# Patient Record
Sex: Male | Born: 1995 | Race: White | Hispanic: No | Marital: Single | Smoking: Current every day smoker
Health system: Southern US, Community
[De-identification: ages and names within clinical notes are randomized; demographics above are authoritative.]

---

## 2019-03-05 ENCOUNTER — Encounter (HOSPITAL_COMMUNITY): Payer: Self-pay | Admitting: Emergency Medicine

## 2019-03-05 ENCOUNTER — Other Ambulatory Visit: Payer: Self-pay

## 2019-03-05 ENCOUNTER — Emergency Department (HOSPITAL_COMMUNITY)
Admission: EM | Admit: 2019-03-05 | Discharge: 2019-03-05 | Disposition: A | Discharge status: Home / Self Care | Payer: Self-pay | Attending: Emergency Medicine | Admitting: Emergency Medicine

## 2019-03-05 DIAGNOSIS — F1910 Other psychoactive substance abuse, uncomplicated: Secondary | ICD-10-CM | POA: Insufficient documentation

## 2019-03-05 DIAGNOSIS — F17210 Nicotine dependence, cigarettes, uncomplicated: Secondary | ICD-10-CM | POA: Insufficient documentation

## 2019-03-05 LAB — RAPID URINE DRUG SCREEN, HOSP PERFORMED
Amphetamines: POSITIVE — AB
Barbiturates: NOT DETECTED
Benzodiazepines: NOT DETECTED
Cocaine: NOT DETECTED
Opiates: NOT DETECTED
Tetrahydrocannabinol: POSITIVE — AB

## 2019-03-05 NOTE — ED Triage Notes (Signed)
Pt states he is trying to get into [facility] and he needs ETOH detox or a letter stating that he doesn't need detox.  Pt denies ETOH problem.  States he goes 2-3 weeks at a time without drinking and may sometimes drink 2-3 days in a row.  PT denies having withdrawal symptoms when not drinking.

## 2019-03-05 NOTE — Discharge Instructions (Addendum)
Mr. Sevey was seen in the ER for medical clearance. Although Mr. Pieper admits to substance abuse earlier this week he is not in need for medical detox at this time. We have advised him to refrain from substance abuse and given him resources on rehab facilities.   Substance Abuse Treatment Programs  Intensive Outpatient Programs [facility]     [address]      [place], [place]                   [phone]       [facility] [address] [place] [address] [place], [place] [phone]  [facility]     (Inpatient and outpatient)     [address]           [phone]    [facility] [phone] (Suboxone and Methadone)  [address]      [place], [place] [address]      [phone]       [address] [place], [place] [phone]  [facility] (Outpatient/Inpatient, [facility])    (insurance only) [phone]             [facility] (Groups & Residential) [place], [place] [phone]     [facility]     [address]     [place], [place]      [phone]       [facility] (for caregivers and family) [phone] [address] Laurell [address] [place], [place] [phone]      Residential Treatment Programs [facility]      [address] [place], [place] [address]  [phone]       [facility] [address], [place], [place] [address] [phone]  [facility] [place]        [phone]       [facility] [phone]  [facility] ([facility]             [address]                                         [place], [place]                                                [phone] or [phone]                               [facility] [place] [address] [place], [address] [phone]  [facility]    [address]      [place], [place]     [phone]       [facility] [address] [place], [place] [phone]  [facility]   [address] [place], [place] [address]     [phone]      Admissions: 8am-3pm M-F  [facility] ([facility]) [address] [place], [place] [phone]  [facility]: Residential Program [phone] Days)   [place], [place]      [phone] or [phone]     [facility]: [facility] [place], [place] (Walk in Hours over the  weekend or by referral)  [facility] [place], [place], [place] [address] [phone]  Crisis Mobile: [facility]:  [phone] (for crisis response 24 hours a day) [facility] Hotline:      [phone] Outpatient Psychiatry and Counseling  [facility]: Mobile Crisis Management 24 hours:  [phone]  [facility] of the [employer] sliding scale fee and walk in schedule: M-F 8am-12pm/1pm-3pm [place], [place] [address] [address] [place], [place] [address] [phone]  [facility] ([facility])- new patient walk-in appointments available Monday - Friday 8am -3pm.          [address] [place], [place] [address] [phone] or crisis line- [facility]/ Intensive Outpatient Therapy Program [place], [place] [address] [place]      [phone] [address] [place], [place] [address]                 [place]   [facility] [phone]. [place], [place] [address]   [facility]          [address] Johnette Abraham  [place], [place] [address]       [phone]  [facility] [address], [place], [place]  [address] [phone]  [facility]    [address] [place], [place] [address]     [place], [place] Joycelyn Man     [place] [address]     [phone]       [facility] [place] [address]  [facility]     [address], [place], [place], [place] [address] [place]     [address]     [place], [place] [address]     [phone]       [facility] [address] [place], [place] [address] [phone] Resources for Temporary Residential Assistance/Crisis [facility]) M-F 8am-3pm   [address], [place] [address]   [phone] Services include: laundry, barbering, support groups, case management, phone  & computer access, showers, AA/NA mtgs, mental health/substance abuse nurse, job skills class, disability information, VA assistance, spiritual classes, etc.   [facility]   [address], [place]     [place]              [employer] (women and children)       [place]. [place], [place] [address] [phone] [email or web address] for application and process Application Required  [facility]   [address]    [place]  [address]     [phone]  [place], [place] [address] [phone](DUKGURKY application appt.) Application Required  [facility] (women only)    [address]     [place], [place] [address]     [phone]      Intake starts 6pm daily Need valid ID, SSC, & Police report [employer] [address] [place], [place] [phone] Application Required  [employer] (men only)     [place].      [place], [place]       [place] (Pregnant women only) [address] [place], [place]  [facility]      [address]      [place], [place] [address]     [phone]             [facility] [address] [place], [place] 90 day commitment/SA/Application process  [facility](men only)     [address]     [place], [place]       Check-in at [facility] [address] [place], [place] [address] [phone] Men/Women/Women and Children must be there by 7 pm  [place], [place]

## 2019-03-05 NOTE — ED Provider Notes (Signed)
[facility] EMERGENCY DEPARTMENT Provider Note   CSN: [id number] Arrival date & time: 03/05/19  0254     History   Chief Complaint Chief Complaint  Patient presents with  . Drug / Alcohol Assessment    HPI Joel Shaw is a 23 y.o. male.     HPI  23 year old comes in a chief complaint of detox. Patient admits to polysubstance abuse.  Over the last week he has used cocaine, meth, heroin.  He will drink alcohol heavily on occasions.  He enrolled in a work training program and was sent to the ER for detox.  He denies any chest pain, palpitations, abdominal pain, headaches, vomiting.   History reviewed. No pertinent past medical history.  There are no active problems to display for this patient.   History reviewed. No pertinent surgical history.      Home Medications    Prior to Admission medications   Not on File    Family History No family history on file.  Social History Social History   Tobacco Use  . Smoking status: Current Every Day Smoker  . Smokeless tobacco: Never Used  Substance Use Topics  . Alcohol use: Yes  . Drug use: Not Currently    Types: Methamphetamines, Cocaine    Comment: Meth, Heroin, crack- denies current use 03/05/19     Allergies   Patient has no allergy information on record.   Review of Systems Review of Systems  Constitutional: Negative for activity change.  Respiratory: Negative for shortness of breath.   Cardiovascular: Negative for chest pain.  Gastrointestinal: Negative for nausea and vomiting.  Neurological: Negative for headaches.     Physical Exam Updated Vital Signs BP 124/77 (BP Location: Right Arm)   Pulse 72   Temp 98.5 F (36.9 C) (Oral)   Resp 18   SpO2 100%   Physical Exam Vitals signs and nursing note reviewed.  Constitutional:      Appearance: He is well-developed.  HENT:     Head: Atraumatic.  Neck:     Musculoskeletal: Neck supple.  Cardiovascular:     Rate and  Rhythm: Normal rate.  Pulmonary:     Effort: Pulmonary effort is normal.  Skin:    General: Skin is warm.     Findings: Bruising, lesion and rash present.  Neurological:     Mental Status: He is alert and oriented to person, place, and time.      ED Treatments / Results  Labs (all labs ordered are listed, but only abnormal results are displayed) Labs Reviewed  RAPID URINE DRUG SCREEN, HOSP PERFORMED - Abnormal; Notable for the following components:      Result Value   Amphetamines POSITIVE (*)    Tetrahydrocannabinol POSITIVE (*)    All other components within normal limits    EKG None  Radiology No results found.  Procedures Procedures (including critical care time)  Medications Ordered in ED Medications - No data to display   Initial Impression / Assessment and Plan / ED Course  I have reviewed the triage vital signs and the nursing notes.  Pertinent labs & imaging results that were available during my care of the patient were reviewed by me and considered in my medical decision making (see chart for details).        Patient comes in a chief complaint of detox. Essentially he has enrolled in a work education program and given his polysubstance abuse they are requesting medical detox.  Patient admits to using  multiple drugs.  His last use was 2 days ago and it was meth.  Currently he is not having any palpitations, tremors, cardiac, GI, neuro symptoms.  He does not need any medical detox at this time.  We have provided him with peer support consult, outpatient rehab facility options.   Final Clinical Impressions(s) / ED Diagnoses   Final diagnoses:  Polysubstance abuse [facility])    ED Discharge Orders    None       Derwood Kaplan, MD 03/05/19 1117

## 2019-03-17 ENCOUNTER — Other Ambulatory Visit: Payer: Self-pay

## 2019-03-17 DIAGNOSIS — Z20822 Contact with and (suspected) exposure to covid-19: Secondary | ICD-10-CM

## 2019-03-18 LAB — NOVEL CORONAVIRUS, NAA: SARS-CoV-2, NAA: NOT DETECTED

## 2019-03-29 ENCOUNTER — Emergency Department (HOSPITAL_COMMUNITY)
Admission: EM | Admit: 2019-03-29 | Discharge: 2019-03-30 | Disposition: A | Discharge status: Home / Self Care | Payer: Self-pay | Attending: Emergency Medicine | Admitting: Emergency Medicine

## 2019-03-29 ENCOUNTER — Other Ambulatory Visit: Payer: Self-pay

## 2019-03-29 ENCOUNTER — Emergency Department (HOSPITAL_COMMUNITY): Payer: Self-pay

## 2019-03-29 ENCOUNTER — Encounter (HOSPITAL_COMMUNITY): Payer: Self-pay | Admitting: Emergency Medicine

## 2019-03-29 DIAGNOSIS — F1410 Cocaine abuse, uncomplicated: Secondary | ICD-10-CM | POA: Insufficient documentation

## 2019-03-29 DIAGNOSIS — Z5900 Homelessness unspecified: Secondary | ICD-10-CM

## 2019-03-29 DIAGNOSIS — F17210 Nicotine dependence, cigarettes, uncomplicated: Secondary | ICD-10-CM | POA: Insufficient documentation

## 2019-03-29 DIAGNOSIS — T401X1A Poisoning by heroin, accidental (unintentional), initial encounter: Secondary | ICD-10-CM | POA: Insufficient documentation

## 2019-03-29 DIAGNOSIS — F1910 Other psychoactive substance abuse, uncomplicated: Secondary | ICD-10-CM | POA: Insufficient documentation

## 2019-03-29 NOTE — Discharge Instructions (Addendum)
In addition to these prescriptions he may take naproxen, 500 mg 2 times a day as needed for aching, pain, or discomfort.   You may also take Imodium, 2 to 4 mg as needed for diarrhea or loose stools. Some of the prescribed medications such as the methocarbromal and hydroxyzine can make you drowsy and sleepy.

## 2019-03-29 NOTE — ED Notes (Signed)
X-ray at bedside

## 2019-03-29 NOTE — ED Provider Notes (Signed)
[facility] DEPT Provider Note   CSN: [id number] Arrival date & time: 03/29/19  2114     History Chief Complaint  Patient presents with  . Drug Overdose    Joel Shaw is a 24 y.o. male with a past medical history of polysubstance abuse including cocaine, meth, and heroin who presents today for evaluation after he required Narcan. [place]  He states that he snorts heroin and does not inject it.  Tonight he used a speed ball which he clarifies as cocaine mixed with heroin.  He states that this was a new batch.  He remembers going into the bathroom to do a second dose and then does not remember anything after.  According to triage notes he required Narcan.    He denies any pains at this time.  He reports that he has not used over the past 3 days as he is trying to get clean however based on his withdrawal related symptoms he used today to stop the symptoms.  He states that he has a job that he is trying to get and needs to be clean.   He states that he has never required Narcan before.  He denies any SI, HI, or AVH.  He states this was not an attempt to harm himself or end his life.  HPI     History reviewed. No pertinent past medical history.  There are no problems to display for this patient.   History reviewed. No pertinent surgical history.     History reviewed. No pertinent family history.  Social History   Tobacco Use  . Smoking status: Current Every Day Smoker  . Smokeless tobacco: Never Used  Substance Use Topics  . Alcohol use: Yes  . Drug use: Not Currently    Types: Methamphetamines, Cocaine    Comment: Meth, Heroin, crack- denies current use 03/05/19    Home Medications Prior to Admission medications   Medication Sig Start Date End Date Taking? Authorizing Provider  dicyclomine (BENTYL) 20 MG tablet Take 1 tablet (20 mg total) by mouth every 6 (six) hours as needed for spasms (abdominal pain).  03/30/19   Lorin Glass, PA-C  hydrOXYzine (ATARAX/VISTARIL) 25 MG tablet Take 1 tablet (25 mg total) by mouth every 6 (six) hours as needed for anxiety. 03/30/19   Lorin Glass, PA-C  methocarbamol (ROBAXIN) 500 MG tablet Take 1 tablet (500 mg total) by mouth every 8 (eight) hours as needed for muscle spasms. 03/30/19   Lorin Glass, PA-C  ondansetron (ZOFRAN ODT) 4 MG disintegrating tablet Take 1 tablet (4 mg total) by mouth every 6 (six) hours as needed for nausea or vomiting. 03/30/19   Lorin Glass, PA-C    Allergies    Patient has no known allergies.  Review of Systems   Review of Systems  Constitutional: Negative for chills and fever.  HENT: Negative for congestion.   Eyes: Negative for visual disturbance.  Respiratory: Negative for cough and chest tightness.   Cardiovascular: Negative for chest pain.  Gastrointestinal: Negative for abdominal pain, diarrhea, nausea and vomiting.  Genitourinary: Negative for dysuria.  Musculoskeletal: Negative for back pain and neck pain.  Neurological: Negative for weakness and headaches.  Psychiatric/Behavioral: Negative for confusion and self-injury.  All other systems reviewed and are negative.   Physical Exam Updated Vital Signs BP 116/65 (BP Location: Right Arm)   Pulse 76   Temp 98.7 F (37.1 C) (Oral)   Resp 18  Ht 5' 9.5" (1.765 m)   Wt 81.6 kg   SpO2 98%   BMI 26.20 kg/m   Physical Exam Vitals and nursing note reviewed.  Constitutional:      General: He is not in acute distress.    Appearance: He is well-developed. He is not diaphoretic.  HENT:     Head: Normocephalic and atraumatic.  Eyes:     General: No scleral icterus.       Right eye: No discharge.        Left eye: No discharge.     Conjunctiva/sclera: Conjunctivae normal.  Cardiovascular:     Rate and Rhythm: Normal rate and regular rhythm.  Pulmonary:     Effort: Pulmonary effort is normal. No respiratory distress.     Breath sounds:  No stridor.  Abdominal:     General: There is no distension.  Musculoskeletal:        General: No deformity.     Cervical back: Normal range of motion.  Skin:    General: Skin is warm and dry.  Neurological:     Mental Status: He is alert.     Motor: No abnormal muscle tone.     Comments: Patient awakens to name without difficulty.  He is alert and oriented x3.  His speech is not slurred.  Facial movements are symmetrical.  He moves all extremities spontaneously.    Psychiatric:        Mood and Affect: Mood normal.        Behavior: Behavior normal.     ED Results / Procedures / Treatments   Labs (all labs ordered are listed, but only abnormal results are displayed) Labs Reviewed  BASIC METABOLIC PANEL - Abnormal; Notable for the following components:      Result Value   Glucose, Bld 108 (*)    Calcium 8.7 (*)    All other components within normal limits  ETHANOL - Abnormal; Notable for the following components:   Alcohol, Ethyl (B) 73 (*)    All other components within normal limits  CBC WITH DIFFERENTIAL/PLATELET - Abnormal; Notable for the following components:   WBC 11.9 (*)    Platelets 124 (*)    Neutro Abs 8.3 (*)    All other components within normal limits    EKG None  Radiology DG Chest Port 1 View  Result Date: 03/29/2019 CLINICAL DATA:  Drug overdose. EXAM: PORTABLE CHEST 1 VIEW COMPARISON:  None. FINDINGS: Right costophrenic angle not included in the field of viewthe cardiomediastinal contours are normal. The lungs are clear. Pulmonary vasculature is normal. No consolidation, pleural effusion, or pneumothorax. No acute osseous abnormalities are seen. IMPRESSION: Negative portable AP view of the chest. Electronically Signed   By: Keith Rake M.D.   On: 03/29/2019 23:12    Procedures Procedures (including critical care time)  Medications Ordered in ED Medications  naloxone (NARCAN) nasal spray 4 mg/0.1 mL (1 spray Nasal Provided for home use 03/30/19  0129)    ED Course  I have reviewed the triage vital signs and the nursing notes.  Pertinent labs & imaging results that were available during my care of the patient were reviewed by me and considered in my medical decision making (see chart for details).    MDM Rules/Calculators/A&P                     Patient presents today for evaluation after a overdose.  He states that he used heroin and cocaine.  According to triage notes he required Narcan in the field. On my evaluation he awakens easily, is conversant.  He denies this being an intentional overdose, stating that he was going through withdrawals as he was trying to quit for a job he has which caused him to use.  I evaluated him multiple times while in the emergency room and he did not require additional Narcan.  He was observed for over 4-1/2 hours in the ER without requiring additional Narcan.  Narcan kit was ordered and he was provided instructions on how to use it.  He is given prescriptions to help with symptomatic treatment of opioid withdrawal.  He is also provided with resources for outpatient substance abuse and counseling, along with shelters as he is homeless.  He was able to p.o. challenge without difficulty.  Recommended risk reduction if he does continue to use and I spoke about some of the local organizations.  Labs are obtained he does have a slight leukocytosis at 11.9 however I suspect this is reactive.  BMP without significant abnormalities.  Ethanol is minimally elevated at 73 however he remained clinically sober while in my care.  Given his overdose chest x-ray was obtained without evidence of aspiration.  Return precautions were discussed with patient who states their understanding.  At the time of discharge patient denied any unaddressed complaints or concerns.  Patient is agreeable for discharge home.  Note: Portions of this report may have been transcribed using voice recognition software. Every effort was made  to ensure accuracy; however, inadvertent computerized transcription errors may be present   Final Clinical Impression(s) / ED Diagnoses Final diagnoses:  Accidental overdose of heroin, initial encounter ([place])  Polysubstance abuse [facility])  Homeless    Rx / DC Orders ED Discharge Orders         Ordered    ondansetron (ZOFRAN ODT) 4 MG disintegrating tablet  Every 6 hours PRN     03/30/19 0109    methocarbamol (ROBAXIN) 500 MG tablet  Every 8 hours PRN     03/30/19 0109    dicyclomine (BENTYL) 20 MG tablet  Every 6 hours PRN     03/30/19 0109    hydrOXYzine (ATARAX/VISTARIL) 25 MG tablet  Every 6 hours PRN     03/30/19 0109           Lorin Glass, PA-C 03/30/19 0144    Quintella Reichert, MD 03/30/19 1454

## 2019-03-29 NOTE — ED Notes (Signed)
Patient made aware that urine sample is needed. Urinal at bedside.  

## 2019-03-29 NOTE — ED Triage Notes (Signed)
Pt found down nonresponsive at [facility] station  by fire dept  who gave pt 2mg  narcan intranasal. Pt was more alert with EMS unknown down time

## 2019-03-30 LAB — CBC WITH DIFFERENTIAL/PLATELET
Abs Immature Granulocytes: 0.04 10*3/uL (ref 0.00–0.07)
Basophils Absolute: 0.1 10*3/uL (ref 0.0–0.1)
Basophils Relative: 0 %
Eosinophils Absolute: 0.1 10*3/uL (ref 0.0–0.5)
Eosinophils Relative: 1 %
HCT: 43.1 % (ref 39.0–52.0)
Hemoglobin: 14.3 g/dL (ref 13.0–17.0)
Immature Granulocytes: 0 %
Lymphocytes Relative: 24 %
Lymphs Abs: 2.8 10*3/uL (ref 0.7–4.0)
MCH: 30.9 pg (ref 26.0–34.0)
MCHC: 33.2 g/dL (ref 30.0–36.0)
MCV: 93.1 fL (ref 80.0–100.0)
Monocytes Absolute: 0.6 10*3/uL (ref 0.1–1.0)
Monocytes Relative: 5 %
Neutro Abs: 8.3 10*3/uL — ABNORMAL HIGH (ref 1.7–7.7)
Neutrophils Relative %: 70 %
Platelets: 124 10*3/uL — ABNORMAL LOW (ref 150–400)
RBC: 4.63 MIL/uL (ref 4.22–5.81)
RDW: 12.3 % (ref 11.5–15.5)
WBC: 11.9 10*3/uL — ABNORMAL HIGH (ref 4.0–10.5)
nRBC: 0 % (ref 0.0–0.2)

## 2019-03-30 LAB — BASIC METABOLIC PANEL
Anion gap: 10 (ref 5–15)
BUN: 11 mg/dL (ref 6–20)
CO2: 25 mmol/L (ref 22–32)
Calcium: 8.7 mg/dL — ABNORMAL LOW (ref 8.9–10.3)
Chloride: 104 mmol/L (ref 98–111)
Creatinine, Ser: 0.76 mg/dL (ref 0.61–1.24)
GFR calc Af Amer: >60 mL/min (ref >60)
GFR calc non Af Amer: >60 mL/min (ref >60)
Glucose, Bld: 108 mg/dL — ABNORMAL HIGH (ref 70–99)
Potassium: 4.1 mmol/L (ref 3.5–5.1)
Sodium: 139 mmol/L (ref 135–145)

## 2019-03-30 LAB — ETHANOL: Alcohol, Ethyl (B): 73 mg/dL — ABNORMAL HIGH (ref <10)

## 2019-03-30 MED ORDER — HYDROXYZINE HCL 25 MG PO TABS: 25.0000 mg | ORAL_TABLET | Freq: Four times a day (QID) | ORAL | 0 refills | Status: DC | PRN

## 2019-03-30 MED ORDER — DICYCLOMINE HCL 20 MG PO TABS: 20.0000 mg | ORAL_TABLET | Freq: Four times a day (QID) | ORAL | 0 refills | Status: DC | PRN

## 2019-03-30 MED ORDER — ONDANSETRON 4 MG PO TBDP: 4.0000 mg | ORAL_TABLET | Freq: Four times a day (QID) | ORAL | 0 refills | Status: DC | PRN

## 2019-03-30 MED ORDER — NALOXONE HCL 4 MG/0.1ML NA LIQD
1.0000 | Freq: Once | NASAL | Status: AC
  Administered 2019-03-30: 1 via NASAL
  Filled 2019-03-30: qty 4

## 2019-03-30 MED ORDER — METHOCARBAMOL 500 MG PO TABS: 500.0000 mg | ORAL_TABLET | Freq: Three times a day (TID) | ORAL | 0 refills | Status: DC | PRN

## 2020-07-16 ENCOUNTER — Emergency Department (HOSPITAL_COMMUNITY)
Admission: EM | Admit: 2020-07-16 | Discharge: 2020-07-16 | Disposition: A | Discharge status: Home / Self Care | Payer: Medicaid Other | Attending: Emergency Medicine | Admitting: Emergency Medicine

## 2020-07-16 ENCOUNTER — Encounter (HOSPITAL_COMMUNITY): Payer: Self-pay

## 2020-07-16 DIAGNOSIS — F17200 Nicotine dependence, unspecified, uncomplicated: Secondary | ICD-10-CM | POA: Insufficient documentation

## 2020-07-16 DIAGNOSIS — F1110 Opioid abuse, uncomplicated: Secondary | ICD-10-CM | POA: Insufficient documentation

## 2020-07-16 DIAGNOSIS — F1910 Other psychoactive substance abuse, uncomplicated: Secondary | ICD-10-CM

## 2020-07-16 MED ORDER — NALOXONE HCL 4 MG/0.1ML NA LIQD: NASAL | 0 refills | Status: AC

## 2020-07-16 NOTE — Discharge Instructions (Signed)
Return for any problem.  ?

## 2020-07-16 NOTE — ED Provider Notes (Signed)
[facility]-EMERGENCY DEPT Provider Note   CSN: [id number] Arrival date & time: 07/16/20  1931     History Chief Complaint  Patient presents with  . Drug Overdose    Joel Shaw is a 25 y.o. male.  25 year old male with prior medical history as detailed below presents for evaluation.  Patient reports that he was drinking a 40oz beer and then decided to snort some heroin.  Shortly after snorting the heroin he became unresponsive.  EMS was called to the scene.  Patient was given a total of 2 mg of Narcan intranasally with minimal effect.  He was then given 0.5 mg of Narcan through the IV with significant improvement in his mental status and respiratory drive.  Upon arrival to the ED the patient is alert and breathing comfortably.  He answers all questions.  He has no specific complaint.  He reports that he had 1 40 ounce drink prior to using what he thought was heroin.  The history is provided by the patient and medical records.  Drug Overdose This is a recurrent problem. The current episode started less than 1 hour ago. The problem occurs rarely. The problem has not changed since onset.Pertinent negatives include no chest pain and no abdominal pain. Nothing aggravates the symptoms. Nothing relieves the symptoms.       History reviewed. No pertinent past medical history.  There are no problems to display for this patient.   History reviewed. No pertinent surgical history.     History reviewed. No pertinent family history.  Social History   Tobacco Use  . Smoking status: Current Every Day Smoker  . Smokeless tobacco: Never Used  Substance Use Topics  . Alcohol use: Yes  . Drug use: Not Currently    Types: Methamphetamines, Cocaine    Comment: Meth, Heroin, crack- denies current use 03/05/19    Home Medications Prior to Admission medications   Medication Sig Start Date End Date Taking? Authorizing Provider  dicyclomine (BENTYL) 20 MG tablet Take  1 tablet (20 mg total) by mouth every 6 (six) hours as needed for spasms (abdominal pain). 03/30/19   Cristina Gong, PA-C  hydrOXYzine (ATARAX/VISTARIL) 25 MG tablet Take 1 tablet (25 mg total) by mouth every 6 (six) hours as needed for anxiety. 03/30/19   Cristina Gong, PA-C  methocarbamol (ROBAXIN) 500 MG tablet Take 1 tablet (500 mg total) by mouth every 8 (eight) hours as needed for muscle spasms. 03/30/19   Cristina Gong, PA-C  ondansetron (ZOFRAN ODT) 4 MG disintegrating tablet Take 1 tablet (4 mg total) by mouth every 6 (six) hours as needed for nausea or vomiting. 03/30/19   Cristina Gong, PA-C    Allergies    Patient has no known allergies.  Review of Systems   Review of Systems  Cardiovascular: Negative for chest pain.  Gastrointestinal: Negative for abdominal pain.  All other systems reviewed and are negative.   Physical Exam Updated Vital Signs BP (!) 141/88 (BP Location: Right Arm)   Pulse 94   Temp 98.4 F (36.9 C) (Oral)   Resp (!) 8   Ht 5\' 9"  (1.753 m)   SpO2 97%   BMI 26.58 kg/m   Physical Exam Vitals and nursing note reviewed.  Constitutional:      General: He is not in acute distress.    Appearance: Normal appearance. He is well-developed.  HENT:     Head: Normocephalic and atraumatic.  Eyes:     Conjunctiva/sclera: Conjunctivae  normal.     Pupils: Pupils are equal, round, and reactive to light.  Cardiovascular:     Rate and Rhythm: Normal rate and regular rhythm.     Heart sounds: Normal heart sounds.  Pulmonary:     Effort: Pulmonary effort is normal. No respiratory distress.     Breath sounds: Normal breath sounds.  Abdominal:     General: There is no distension.     Palpations: Abdomen is soft.     Tenderness: There is no abdominal tenderness.  Musculoskeletal:        General: No deformity. Normal range of motion.     Cervical back: Normal range of motion and neck supple.  Skin:    General: Skin is warm and dry.   Neurological:     General: No focal deficit present.     Mental Status: He is alert and oriented to person, place, and time.     ED Results / Procedures / Treatments   Labs (all labs ordered are listed, but only abnormal results are displayed) Labs Reviewed - No data to display  EKG None  Radiology No results found.  Procedures Procedures   Medications Ordered in ED Medications - No data to display  ED Course  I have reviewed the triage vital signs and the nursing notes.  Pertinent labs & imaging results that were available during my care of the patient were reviewed by me and considered in my medical decision making (see chart for details).    MDM Rules/Calculators/A&P                          MDM  MSE complete  Joel Shaw was evaluated in Emergency Department on 07/16/2020 for the symptoms described in the history of present illness. He was evaluated in the context of the global COVID-19 pandemic, which necessitated consideration that the patient might be at risk for infection with the SARS-CoV-2 virus that causes COVID-19. Institutional protocols and algorithms that pertain to the evaluation of patients at risk for COVID-19 are in a state of rapid change based on information released by regulatory bodies including the CDC and federal and state organizations. These policies and algorithms were followed during the patient's care in the ED.  Patient presented for evaluation and treatment after reported overdose which required Narcan prior to arrival.  Patient was observed in the ED without significant respiratory distress or hypoxia noted.  After lengthy period of observation the patient desires discharge.  He is awake, alert, comfortable at time of discharge.  He understands that taking medication such as heroin could result in his death or permanent disability.  He understands outpatient resources available to help him with his drug addiction.  Strict return  precautions given and understood.     Final Clinical Impression(s) / ED Diagnoses Final diagnoses:  Drug abuse [facility])    Rx / DC Orders ED Discharge Orders    None       Wynetta Fines, MD 07/16/20 2227

## 2020-07-16 NOTE — ED Notes (Signed)
Pt ambulatory unassisted to bathroom, given water and a sandwich.

## 2020-07-16 NOTE — ED Triage Notes (Signed)
Pt came in via EMS [facility]. Pt was found unresponsive and admits to snorting heroin and drinking alcohol. He was given 2mg  of IN Narcan. No response so 0.5mg  of IV Narcan given. Pts current saturations 96% on RA. BP 141/88

## 2020-07-19 ENCOUNTER — Telehealth (HOSPITAL_COMMUNITY): Payer: Self-pay

## 2020-07-19 ENCOUNTER — Other Ambulatory Visit: Payer: Self-pay

## 2020-07-19 ENCOUNTER — Encounter (HOSPITAL_COMMUNITY): Payer: Self-pay | Admitting: *Deleted

## 2020-07-19 ENCOUNTER — Emergency Department (HOSPITAL_COMMUNITY)
Admission: EM | Admit: 2020-07-19 | Discharge: 2020-07-19 | Disposition: A | Discharge status: Home / Self Care | Payer: Medicaid Other | Attending: Emergency Medicine | Admitting: Emergency Medicine

## 2020-07-19 ENCOUNTER — Emergency Department (HOSPITAL_COMMUNITY): Payer: Medicaid Other

## 2020-07-19 DIAGNOSIS — R7309 Other abnormal glucose: Secondary | ICD-10-CM | POA: Insufficient documentation

## 2020-07-19 DIAGNOSIS — T40601A Poisoning by unspecified narcotics, accidental (unintentional), initial encounter: Secondary | ICD-10-CM

## 2020-07-19 DIAGNOSIS — T402X1A Poisoning by other opioids, accidental (unintentional), initial encounter: Secondary | ICD-10-CM | POA: Insufficient documentation

## 2020-07-19 DIAGNOSIS — F17200 Nicotine dependence, unspecified, uncomplicated: Secondary | ICD-10-CM | POA: Insufficient documentation

## 2020-07-19 DIAGNOSIS — E871 Hypo-osmolality and hyponatremia: Secondary | ICD-10-CM | POA: Insufficient documentation

## 2020-07-19 DIAGNOSIS — T50901A Poisoning by unspecified drugs, medicaments and biological substances, accidental (unintentional), initial encounter: Secondary | ICD-10-CM

## 2020-07-19 DIAGNOSIS — I480 Paroxysmal atrial fibrillation: Secondary | ICD-10-CM | POA: Insufficient documentation

## 2020-07-19 DIAGNOSIS — X58XXXA Exposure to other specified factors, initial encounter: Secondary | ICD-10-CM | POA: Insufficient documentation

## 2020-07-19 DIAGNOSIS — Z7901 Long term (current) use of anticoagulants: Secondary | ICD-10-CM | POA: Insufficient documentation

## 2020-07-19 LAB — COMPREHENSIVE METABOLIC PANEL
ALT: 98 U/L — ABNORMAL HIGH (ref 0–44)
AST: 54 U/L — ABNORMAL HIGH (ref 15–41)
Albumin: 3.7 g/dL (ref 3.5–5.0)
Alkaline Phosphatase: 60 U/L (ref 38–126)
Anion gap: 10 (ref 5–15)
BUN: 7 mg/dL (ref 6–20)
CO2: 27 mmol/L (ref 22–32)
Calcium: 8.5 mg/dL — ABNORMAL LOW (ref 8.9–10.3)
Chloride: 97 mmol/L — ABNORMAL LOW (ref 98–111)
Creatinine, Ser: 1.01 mg/dL (ref 0.61–1.24)
GFR, Estimated: >60 mL/min (ref >60)
Glucose, Bld: 206 mg/dL — ABNORMAL HIGH (ref 70–99)
Potassium: 4.6 mmol/L (ref 3.5–5.1)
Sodium: 134 mmol/L — ABNORMAL LOW (ref 135–145)
Total Bilirubin: 0.6 mg/dL (ref 0.3–1.2)
Total Protein: 6.2 g/dL — ABNORMAL LOW (ref 6.5–8.1)

## 2020-07-19 LAB — RAPID URINE DRUG SCREEN, HOSP PERFORMED
Amphetamines: NOT DETECTED
Barbiturates: NOT DETECTED
Benzodiazepines: NOT DETECTED
Cocaine: POSITIVE — AB
Opiates: NOT DETECTED
Tetrahydrocannabinol: NOT DETECTED

## 2020-07-19 LAB — CBC WITH DIFFERENTIAL/PLATELET
Abs Immature Granulocytes: 0.04 10*3/uL (ref 0.00–0.07)
Basophils Absolute: 0 10*3/uL (ref 0.0–0.1)
Basophils Relative: 0 %
Eosinophils Absolute: 0.2 10*3/uL (ref 0.0–0.5)
Eosinophils Relative: 2 %
HCT: 45.7 % (ref 39.0–52.0)
Hemoglobin: 15 g/dL (ref 13.0–17.0)
Immature Granulocytes: 0 %
Lymphocytes Relative: 27 %
Lymphs Abs: 2.5 10*3/uL (ref 0.7–4.0)
MCH: 30.4 pg (ref 26.0–34.0)
MCHC: 32.8 g/dL (ref 30.0–36.0)
MCV: 92.5 fL (ref 80.0–100.0)
Monocytes Absolute: 0.5 10*3/uL (ref 0.1–1.0)
Monocytes Relative: 5 %
Neutro Abs: 6 10*3/uL (ref 1.7–7.7)
Neutrophils Relative %: 66 %
Platelets: 180 10*3/uL (ref 150–400)
RBC: 4.94 MIL/uL (ref 4.22–5.81)
RDW: 13.5 % (ref 11.5–15.5)
WBC: 9.2 10*3/uL (ref 4.0–10.5)
nRBC: 0 % (ref 0.0–0.2)

## 2020-07-19 LAB — URINALYSIS, ROUTINE W REFLEX MICROSCOPIC
Bacteria, UA: NONE SEEN
Bilirubin Urine: NEGATIVE
Glucose, UA: >=500 mg/dL — AB
Hgb urine dipstick: NEGATIVE
Ketones, ur: 5 mg/dL — AB
Leukocytes,Ua: NEGATIVE
Nitrite: NEGATIVE
Protein, ur: 30 mg/dL — AB
Specific Gravity, Urine: 1.013 (ref 1.005–1.030)
pH: 6 (ref 5.0–8.0)

## 2020-07-19 LAB — SALICYLATE LEVEL: Salicylate Lvl: <7 mg/dL — ABNORMAL LOW (ref 7.0–30.0)

## 2020-07-19 LAB — ETHANOL: Alcohol, Ethyl (B): <10 mg/dL (ref <10)

## 2020-07-19 LAB — ACETAMINOPHEN LEVEL: Acetaminophen (Tylenol), Serum: <10 ug/mL — ABNORMAL LOW (ref 10–30)

## 2020-07-19 LAB — MAGNESIUM: Magnesium: 1.8 mg/dL (ref 1.7–2.4)

## 2020-07-19 MED ORDER — FLECAINIDE ACETATE 100 MG PO TABS
300.0000 mg | ORAL_TABLET | Freq: Once | ORAL | Status: DC
  Filled 2020-07-19: qty 3

## 2020-07-19 MED ORDER — APIXABAN 5 MG PO TABS
5.0000 mg | ORAL_TABLET | Freq: Once | ORAL | Status: AC
  Administered 2020-07-19: 5 mg via ORAL
  Filled 2020-07-19: qty 1

## 2020-07-19 MED ORDER — APIXABAN 5 MG PO TABS
5.0000 mg | ORAL_TABLET | Freq: Two times a day (BID) | ORAL | 0 refills | Status: DC
Start: 2020-07-19 — End: 2020-07-19

## 2020-07-19 MED ORDER — METOPROLOL TARTRATE 5 MG/5ML IV SOLN
2.5000 mg | INTRAVENOUS | Status: DC | PRN
  Filled 2020-07-19: qty 5

## 2020-07-19 NOTE — ED Notes (Signed)
The pts heart rate slowed down on it own no med had been given  Dr Preston Fleeting cancelled thetambocor and the lopresser

## 2020-07-19 NOTE — Telephone Encounter (Signed)
Reached out to patient to schedule ED f/u. Left voicemail with telephone for patient to call back.

## 2020-07-19 NOTE — ED Notes (Signed)
Nasal 02 at 5 liters  Pt goes to sleep and his sats drop into the 60s
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
## 2020-07-19 NOTE — ED Provider Notes (Signed)
  Physical Exam  BP (!) 121/57   Pulse 74   Temp 98.5 F (36.9 C) (Temporal)   Resp 15   Ht 5\' 9"  (1.753 m)   Wt 81.6 kg   SpO2 94%   BMI 26.57 kg/m   Physical Exam  ED Course/Procedures     Procedures  MDM  Received patient in signout.  Overdose.  Mental status improved now.  Had been hypoxic but now that is resolved.  However found to be in A. fib with RVR.  He spontaneously converted.  Initial plan was to start on anticoagulation.  However CHA2DS2-VASc is 2 and discussed with pharmacy who suggest not starting anticoagulation at this time.  With a follow-up with A. fib clinic.  Discharge home.       , MD 07/19/20 [phone]

## 2020-07-19 NOTE — Discharge Instructions (Addendum)
Please make sure to follow-up with the atrial fibrillation clinic.   You have had several emergency department visits for fentanyl overdose.  You are risking your life every time you use street drugs.  The next time you overdose, you might not be so lucky.  You could die, or suffer permanent brain damage.  Please go through one of the drug treatment programs in the resource page to help you stop using drugs.  Your blood sugar was elevated today.  This needs to be rechecked several times over the next several weeks to make sure that you have not developed diabetes.

## 2020-07-19 NOTE — ED Notes (Signed)
Patient Alert and oriented to baseline. Stable and ambulatory to baseline. Patient verbalized understanding of the discharge instructions.  Patient belongings were taken by the patient.   

## 2020-07-19 NOTE — ED Notes (Signed)
Pt sleeping   Will remove 02 to determine if the pts sats stay normal

## 2020-07-19 NOTE — ED Provider Notes (Signed)
[facility] EMERGENCY DEPARTMENT Provider Note   CSN: [id number] Arrival date & time: 07/19/20  0126     History Chief Complaint  Patient presents with  . Drug Overdose    Joel Shaw is a 25 y.o. male.  The history is provided by the patient and the EMS personnel.  Drug Overdose  He was brought in by ambulance after being found unresponsive in front of [employer].  EMS reports agonal respirations and was treated with naloxone 2 mg intranasally following which patient woke up.  He states that he had snorted heroin, denies other drug use tonight.  He does drink occasionally and will use other drugs occasionally.  When he woke up, he did notice that his heart was racing and he has notes a tight feeling in his chest.  He denies dyspnea, nausea, diaphoresis.  History reviewed. No pertinent past medical history.  There are no problems to display for this patient.   History reviewed. No pertinent surgical history.     No family history on file.  Social History   Tobacco Use  . Smoking status: Current Every Day Smoker  . Smokeless tobacco: Never Used  Substance Use Topics  . Alcohol use: Yes  . Drug use: Not Currently    Types: Methamphetamines, Cocaine    Comment: Meth, Heroin, crack- denies current use 03/05/19    Home Medications Prior to Admission medications   Medication Sig Start Date End Date Taking? Authorizing Provider  ARIPiprazole (ABILIFY) 5 MG tablet Take 5 mg by mouth daily.    [provider]  dicyclomine (BENTYL) 20 MG tablet Take 1 tablet (20 mg total) by mouth every 6 (six) hours as needed for spasms (abdominal pain). Patient not taking: Reported on 07/16/2020 03/30/19   Cristina Gong, PA-C  hydrOXYzine (ATARAX/VISTARIL) 25 MG tablet Take 1 tablet (25 mg total) by mouth every 6 (six) hours as needed for anxiety. Patient not taking: Reported on 07/16/2020 03/30/19   Cristina Gong, PA-C  hydrOXYzine  (ATARAX/VISTARIL) 50 MG tablet Take 50 mg by mouth 3 (three) times daily as needed.    [provider]  methocarbamol (ROBAXIN) 500 MG tablet Take 1 tablet (500 mg total) by mouth every 8 (eight) hours as needed for muscle spasms. Patient not taking: Reported on 07/16/2020 03/30/19   Cristina Gong, PA-C  naloxone [facility]) nasal spray 4 mg/0.1 mL Use after overdose with narcotic. 07/16/20   Wynetta Fines, MD  naltrexone (DEPADE) 50 MG tablet Take by mouth daily.    [provider]  ondansetron (ZOFRAN ODT) 4 MG disintegrating tablet Take 1 tablet (4 mg total) by mouth every 6 (six) hours as needed for nausea or vomiting. Patient not taking: Reported on 07/16/2020 03/30/19   Cristina Gong, PA-C  traZODone (DESYREL) 100 MG tablet Take 100 mg by mouth at bedtime.    [provider]    Allergies    Patient has no known allergies.  Review of Systems   Review of Systems  All other systems reviewed and are negative.   Physical Exam Updated Vital Signs BP 112/61   Pulse 87   Temp 98.8 F (37.1 C)   Resp (!) 23   Ht 5\' 9"  (1.753 m)   Wt 81.6 kg   SpO2 (!) 96%   BMI 26.57 kg/m   Physical Exam Vitals and nursing note reviewed.   25 year old male, resting comfortably and in no acute distress. Vital signs are significant  for elevated respiratory rate. Oxygen saturation is 96%, which is normal. Head is normocephalic and atraumatic. PERRLA, EOMI. Oropharynx is clear. Neck is nontender and supple without adenopathy or JVD. Back is nontender and there is no CVA tenderness. Lungs are clear without rales, wheezes, or rhonchi. Chest is nontender. Heart is tachycardic and irregular without murmur. Abdomen is soft, flat, nontender without masses or hepatosplenomegaly and peristalsis is normoactive. Extremities have no cyanosis or edema, full range of motion is present. Skin is warm and dry without rash. Neurologic: Mental status is normal, cranial nerves are  intact, there are no motor or sensory deficits.  ED Results / Procedures / Treatments   Labs (all labs ordered are listed, but only abnormal results are displayed) Labs Reviewed  COMPREHENSIVE METABOLIC PANEL - Abnormal; Notable for the following components:      Result Value   Sodium 134 (*)    Chloride 97 (*)    Glucose, Bld 206 (*)    Calcium 8.5 (*)    Total Protein 6.2 (*)    AST 54 (*)    ALT 98 (*)    All other components within normal limits  MAGNESIUM  CBC WITH DIFFERENTIAL/PLATELET  ETHANOL  SALICYLATE LEVEL  ACETAMINOPHEN LEVEL  RAPID URINE DRUG SCREEN, HOSP PERFORMED  URINALYSIS, ROUTINE W REFLEX MICROSCOPIC    EKG KEYION, KNACK IR:[id number] 19-Jul-2020 01:26:04 [facility] ROUTINE RECORD October 22, 1995 (24 yr) Male Caucasian Room:TRAAC Loc:0 Technician: [id number] Test ind: Vent. rate 137 BPM PR interval * ms QRS duration 90 ms QT/QTcB 308/465 ms P-R-T axes * 78 10 Atrial fibrillation Ventricular premature complex Minimal ST depression, inferior leads No old tracing to compare Confirmed by Dione Booze ([id number]) on 07/19/2020 2:10:30 AM Confirmed By: Dione Booze  EKG Interpretation  Date/Time:  Monday July 19 2020 03:41:11 EDT Ventricular Rate:  104 PR Interval:  150 QRS Duration: 87 QT Interval:  341 QTC Calculation: 449 R Axis:   89 Text Interpretation: Sinus tachycardia ST elev, probable normal early repol pattern When compared with ECG of EARLIER SAME DATE Sinus rhythm has replaced Atrial fibrillation Premature ventricular complexes are no longer present Confirmed by Dione Booze ([id number]) on 07/19/2020 3:50:17 AM   Radiology No results found.  Procedures Procedures  CRITICAL CARE Performed by: Dione Booze Total critical care time: 60 minutes Critical care time was exclusive of separately billable procedures and treating other patients. Critical care was necessary to treat or prevent imminent or life-threatening  deterioration. Critical care was time spent personally by me on the following activities: development of treatment plan with patient and/or surrogate as well as nursing, discussions with consultants, evaluation of patient's response to treatment, examination of patient, obtaining history from patient or surrogate, ordering and performing treatments and interventions, ordering and review of laboratory studies, ordering and review of radiographic studies, pulse oximetry and re-evaluation of patient's condition.  Medications Ordered in ED Medications  metoprolol tartrate (LOPRESSOR) injection 2.5 mg (has no administration in time range)  apixaban (ELIQUIS) tablet 5 mg (has no administration in time range)    ED Course  I have reviewed the triage vital signs and the nursing notes.  Pertinent labs & imaging results that were available during my care of the patient were reviewed by me and considered in my medical decision making (see chart for details).   MDM Rules/Calculators/A&P                         Opioid overdose, probably  fentanyl.  Monitor shows tachycardia with a regular rhythm.  Appears to be atrial fibrillation.  Will check twelve-lead ECG.  Old records are reviewed, and he has several other ED visits for accidental drug overdose, most recently 07/16/2020.  He will need to be observed in the emergency department a minimum of 4 hours.  ECG confirms atrial fibrillation with rapid ventricular response.  I have recommended DC cardioversion, patient does not wish to have that done.  He prefers oral medication if possible.  We will try oral flecainide.  In the meantime, he will be given a dose of metoprolol to attempt to get rate control.  Labs showed elevated AST and ALT, probably secondary to ethanol abuse.  Mild hyponatremia is present, not felt to be clinically significant.  Glucose is also elevated at 206.  2:54 AM Patient spontaneously converted to sinus rhythm.  This occurred before he  got the flecainide, so flecainide is discontinued.  He will still need to be followed in the atrial fibrillation clinic.  Repeat ECG confirms conversion to sinus rhythm.  Patient was observed for 4hours and did not need any other naloxone.  He was set up for discharge with prescription for apixaban, but he was noted to continue to be hypoxic on room air.  He will be continued to be observed in the ED until he is able to maintain adequate oxygen saturation on room air.  Case is signed out to Dr. Rubin Payor.  CHA2DS2/VAS Stroke Risk Points      N/A >= 2 Points: High Risk  1 - 1.99 Points: Medium Risk  0 Points: Low Risk    Last Change: N/A      This score determines the patient's risk of having a stroke if the  patient has atrial fibrillation.      His score is 0.    Final Clinical Impression(s) / ED Diagnoses Final diagnoses:  Opiate overdose, accidental or unintentional, initial encounter (HCC)  Paroxysmal atrial fibrillation (HCC)  Elevated random blood glucose level  Hyponatremia    Rx / DC Orders ED Discharge Orders         Ordered    Amb referral to AFIB Clinic        07/19/20 0138    apixaban (ELIQUIS) 5 MG TABS tablet  2 times daily        07/19/20 0537           Dione Booze, MD 07/19/20 [phone]

## 2021-03-03 ENCOUNTER — Other Ambulatory Visit: Payer: Self-pay

## 2021-03-03 ENCOUNTER — Emergency Department (HOSPITAL_COMMUNITY)
Admission: EM | Admit: 2021-03-03 | Discharge: 2021-03-03 | Disposition: A | Discharge status: Home / Self Care | Payer: Medicaid Other | Attending: Emergency Medicine | Admitting: Emergency Medicine

## 2021-03-03 DIAGNOSIS — T40601A Poisoning by unspecified narcotics, accidental (unintentional), initial encounter: Secondary | ICD-10-CM

## 2021-03-03 DIAGNOSIS — F17200 Nicotine dependence, unspecified, uncomplicated: Secondary | ICD-10-CM | POA: Insufficient documentation

## 2021-03-03 MED ORDER — NALOXONE HCL 0.4 MG/ML IJ SOLN: 0.4000 mg | INTRAMUSCULAR | Status: DC | PRN

## 2021-03-03 NOTE — Discharge Instructions (Signed)
You were evaluated in the Emergency Department and after careful evaluation, we did not find any emergent condition requiring admission or further testing in the hospital.  Your exam/testing today was overall reassuring.  Please return to the Emergency Department if you experience any worsening of your condition.  Thank you for allowing us to be a part of your care.  

## 2021-03-03 NOTE — ED Provider Notes (Signed)
[facility] Emergency Department Provider Note MRN:  [id number]  Arrival date & time: 03/03/21     Chief Complaint   Drug Overdose (Pt 'doesnt know what he took", given 2mg  narcan IN PTA)   History of Present Illness   Joel Shaw is a 25 y.o. year-old male with a history of substance abuse presenting to the ED with chief complaint of drug overdose.  Suspected drug overdose, patient does not remember what drugs he was doing with thinks he took too many.  Unsure if he ingested or injected or snorted.  Does not really remember anything.  Patient is somnolent but wakes to voice.  I was unable to obtain an accurate HPI, PMH, or ROS due to the patient's altered mental status.  Level 5 caveat.  Review of Systems  Positive for drug use, altered mental status.  Patient's Health History   No past medical history on file.  No past surgical history on file.  No family history on file.  Social History   Socioeconomic History   Marital status: Single    Spouse name: Not on file   Number of children: Not on file   Years of education: Not on file   Highest education level: Not on file  Occupational History   Not on file  Tobacco Use   Smoking status: Every Day   Smokeless tobacco: Never  Substance and Sexual Activity   Alcohol use: Yes   Drug use: Not Currently    Types: Methamphetamines, Cocaine    Comment: Meth, Heroin, crack- denies current use 03/05/19   Sexual activity: Not Currently  Other Topics Concern   Not on file  Social History Narrative   Not on file   Social Determinants of Health   Financial Resource Strain: Not on file  Food Insecurity: Not on file  Transportation Needs: Not on file  Physical Activity: Not on file  Stress: Not on file  Social Connections: Not on file  Intimate Partner Violence: Not on file     Physical Exam   Vitals:   03/03/21 0200 03/03/21 0245  BP: 122/83 132/84  Pulse: 76 92  Resp: 13 18  Temp:    SpO2:  100% 96%    CONSTITUTIONAL: Chronically ill-appearing, NAD NEURO: Somnolent but wakes to voice, moves all extremities, mildly confused EYES:  eyes equal and reactive ENT/NECK:  no LAD, no JVD CARDIO: Regular rate, well-perfused, normal S1 and S2 PULM:  CTAB no wheezing or rhonchi GI/GU:  normal bowel sounds, non-distended, non-tender MSK/SPINE:  No gross deformities, no edema SKIN:  no rash, atraumatic PSYCH:  Appropriate speech and behavior  *Additional and/or pertinent findings included in MDM below  Diagnostic and Interventional Summary    EKG Interpretation  Date/Time:    Ventricular Rate:    PR Interval:    QRS Duration:   QT Interval:    QTC Calculation:   R Axis:     Text Interpretation:         Labs Reviewed - No data to display  No orders to display    Medications  naloxone (NARCAN) injection 0.4 mg (has no administration in time range)     Procedures  /  Critical Care Procedures  ED Course and Medical Decision Making  I have reviewed the triage vital signs, the nursing notes, and pertinent available records from the EMR.  Listed above are laboratory and imaging tests that I personally ordered, reviewed, and interpreted and then considered in my medical decision  making (see below for details).  Suspected opioid overdose as patient improved with Narcan given in the field.  A bit somnolent but breathing adequately, wakes easily.  Will monitor for a few hours here in the emergency department for any resedation.  When awake and talking he has no physical complaints at this time.  No signs of trauma on exam.     After 4 hours of monitoring patient continues to look and feel well, no concern for significant presedation.  Patient counseled on the risks of death with further opioid use.  Appropriate for discharge.  Elmer Sow. Pilar Plate, MD [facility] Emergency Medicine [facility] [email or web address]  Final Clinical Impressions(s) / ED Diagnoses      ICD-10-CM   1. Opiate overdose, accidental or unintentional, initial encounter [facility])  T40.601A       ED Discharge Orders     None        Discharge Instructions Discussed with and Provided to Patient:     Discharge Instructions      You were evaluated in the Emergency Department and after careful evaluation, we did not find any emergent condition requiring admission or further testing in the hospital.  Your exam/testing today was overall reassuring.  Please return to the Emergency Department if you experience any worsening of your condition.  Thank you for allowing [place] to be a part of your care.         Sabas Sous, MD 03/03/21 [phone]
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
## 2021-03-13 ENCOUNTER — Emergency Department (HOSPITAL_COMMUNITY)
Admission: EM | Admit: 2021-03-13 | Discharge: 2021-03-13 | Disposition: A | Discharge status: Home / Self Care | Payer: Medicaid Other | Attending: Emergency Medicine | Admitting: Emergency Medicine

## 2021-03-13 DIAGNOSIS — T40411A Poisoning by fentanyl or fentanyl analogs, accidental (unintentional), initial encounter: Secondary | ICD-10-CM | POA: Insufficient documentation

## 2021-03-13 DIAGNOSIS — T40601A Poisoning by unspecified narcotics, accidental (unintentional), initial encounter: Secondary | ICD-10-CM

## 2021-03-13 DIAGNOSIS — Z7901 Long term (current) use of anticoagulants: Secondary | ICD-10-CM | POA: Insufficient documentation

## 2021-03-13 DIAGNOSIS — Z79899 Other long term (current) drug therapy: Secondary | ICD-10-CM | POA: Insufficient documentation

## 2021-03-13 DIAGNOSIS — F17200 Nicotine dependence, unspecified, uncomplicated: Secondary | ICD-10-CM | POA: Insufficient documentation

## 2021-03-13 LAB — CBG MONITORING, ED: Glucose-Capillary: 130 mg/dL — ABNORMAL HIGH (ref 70–99)

## 2021-03-13 MED ORDER — NALOXONE HCL 4 MG/0.1ML NA LIQD
1.0000 | Freq: Once | NASAL | Status: AC
  Administered 2021-03-13: 08:00:00 1 via NASAL
  Filled 2021-03-13: qty 4

## 2021-03-13 NOTE — ED Provider Notes (Signed)
[facility] EMERGENCY DEPARTMENT Provider Note   CSN: [id number] Arrival date & time: 03/13/21  0547     History Chief Complaint  Patient presents with   Drug Overdose    Joel Shaw is a 25 y.o. male.  The history is provided by the patient and the EMS personnel.  Drug Overdose Joel Shaw is a 25 y.o. male who presents to the Emergency Department complaining of overdose.  He presents to the emergency department following accidental overdose on fentanyl.  Per EMS he was at the [facility] and sitting in a chair when he slumped over with agonal respirations.  He states that he used fentanyl by snorting about an hour prior to that.  He states that he uses it recreationally.  He recently left rehab in [place] a few weeks ago for similar issues.  No IV drug use.  He does smoke tobacco, no significant alcohol use.  EMS reports agonal respirations and was bagged.  He received 2 mg of nasal Narcan and became responsive.  Patient denies any SI, HI.  No somatic complaints.  He is interested in rehab if that is available.  He has no known medical problems.     No past medical history on file.  There are no problems to display for this patient.   No past surgical history on file.     No family history on file.  Social History   Tobacco Use   Smoking status: Every Day   Smokeless tobacco: Never  Substance Use Topics   Alcohol use: Yes   Drug use: Not Currently    Types: Methamphetamines, Cocaine    Comment: Meth, Heroin, crack- denies current use 03/05/19    Home Medications Prior to Admission medications   Medication Sig Start Date End Date Taking? Authorizing Provider  ARIPiprazole (ABILIFY) 5 MG tablet Take 5 mg by mouth daily.    [provider]  hydrOXYzine (ATARAX/VISTARIL) 50 MG tablet Take 50 mg by mouth 3 (three) times daily as needed.    [provider]  naloxone [facility]) nasal spray 4 mg/0.1 mL Use after overdose with narcotic. 07/16/20    Wynetta Fines, MD  naltrexone (DEPADE) 50 MG tablet Take by mouth daily.    [provider]  traZODone (DESYREL) 100 MG tablet Take 100 mg by mouth at bedtime.    [provider]  apixaban (ELIQUIS) 5 MG TABS tablet Take 1 tablet (5 mg total) by mouth 2 (two) times daily. 07/19/20 07/19/20  Dione Booze, MD  dicyclomine (BENTYL) 20 MG tablet Take 1 tablet (20 mg total) by mouth every 6 (six) hours as needed for spasms (abdominal pain). Patient not taking: Reported on 07/16/2020 03/30/19 07/19/20  Cristina Gong, PA-C    Allergies    Patient has no known allergies.  Review of Systems   Review of Systems  All other systems reviewed and are negative.  Physical Exam Updated Vital Signs BP 106/65    Pulse (!) 59    Temp 98.2 F (36.8 C) (Oral)    Resp 12    Ht 5\' 9"  (1.753 m)    Wt 90.7 kg    SpO2 96%    BMI 29.53 kg/m   Physical Exam Vitals and nursing note reviewed.  Constitutional:      Appearance: He is well-developed.  HENT:     Head: Normocephalic and atraumatic.     Comments: Pupils small, reactive bilaterally Cardiovascular:     Rate and Rhythm: Normal  rate and regular rhythm.     Heart sounds: No murmur heard. Pulmonary:     Effort: Pulmonary effort is normal. No respiratory distress.     Breath sounds: Normal breath sounds.  Abdominal:     Palpations: Abdomen is soft.     Tenderness: There is no abdominal tenderness. There is no guarding or rebound.  Musculoskeletal:        General: No tenderness.  Skin:    General: Skin is warm and dry.  Neurological:     Mental Status: He is alert and oriented to person, place, and time.  Psychiatric:        Behavior: Behavior normal.    ED Results / Procedures / Treatments   Labs (all labs ordered are listed, but only abnormal results are displayed) Labs Reviewed  CBG MONITORING, ED - Abnormal; Notable for the following components:      Result Value   Glucose-Capillary 130 (*)    All other  components within normal limits    EKG None  Radiology No results found.  Procedures Procedures   Medications Ordered in ED Medications  naloxone (NARCAN) nasal spray 4 mg/0.1 mL (1 spray Nasal Provided for home use 03/13/21 0754)    ED Course  I have reviewed the triage vital signs and the nursing notes.  Pertinent labs & imaging results that were available during my care of the patient were reviewed by me and considered in my medical decision making (see chart for details).    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  He received Narcan prior to ED presentation.  On ED evaluation he is awake but easily falls back asleep.  He is not suicidal, homicidal.  He is requesting assistance with outpatient rehab.  He has had rehab in the past.  Plan to allow to metabolize in the emergency department and then discharged home with outpatient resources.    Final Clinical Impression(s) / ED Diagnoses Final diagnoses:  Opiate overdose, accidental or unintentional, initial encounter [facility])    Rx / DC Orders ED Discharge Orders     None        Tilden Fossa, MD 03/13/21 [phone]

## 2021-03-13 NOTE — ED Provider Notes (Signed)
Blood pressure 107/68, pulse (!) 57, temperature 98.2 F (36.8 C), temperature source Oral, resp. rate 14, height _0  (1.753 m), weight 90.7 kg, SpO2 97 %.  Assuming care from Dr. Ralene Bathe.  In short, Joel Shaw is a 25 y.o. male with a chief complaint of Drug Overdose .  Refer to the original H&P for additional details.  The current plan of care is to follow up after sobering.  10:30 AM  Patient is up and ambulatory w/o difficulty. No assistance required. Narcan IN kit provided at time of discharge.     Margette Fast, MD 03/13/21 1032

## 2021-03-13 NOTE — ED Triage Notes (Signed)
[facility] [place] for a drug over dose. Pt snorted fentanyl around 4 and and fell unresponsive with agonal breathing. Pt was bagged with an NPA  and given 2mg  Narcan. Pt became responsive  and VSS after narcan admin

## 2021-03-25 ENCOUNTER — Emergency Department (HOSPITAL_COMMUNITY): Payer: Medicaid Other

## 2021-03-25 ENCOUNTER — Observation Stay (HOSPITAL_COMMUNITY)
Admission: EM | Admit: 2021-03-25 | Discharge: 2021-03-26 | Disposition: A | Discharge status: Home / Self Care | Payer: Medicaid - Out of State | Attending: Internal Medicine | Admitting: Internal Medicine

## 2021-03-25 DIAGNOSIS — F1190 Opioid use, unspecified, uncomplicated: Secondary | ICD-10-CM | POA: Diagnosis not present

## 2021-03-25 DIAGNOSIS — Y900 Blood alcohol level of less than 20 mg/100 ml: Secondary | ICD-10-CM | POA: Insufficient documentation

## 2021-03-25 DIAGNOSIS — R7309 Other abnormal glucose: Secondary | ICD-10-CM | POA: Insufficient documentation

## 2021-03-25 DIAGNOSIS — X58XXXA Exposure to other specified factors, initial encounter: Secondary | ICD-10-CM | POA: Insufficient documentation

## 2021-03-25 DIAGNOSIS — J9601 Acute respiratory failure with hypoxia: Secondary | ICD-10-CM | POA: Diagnosis not present

## 2021-03-25 DIAGNOSIS — T40601A Poisoning by unspecified narcotics, accidental (unintentional), initial encounter: Secondary | ICD-10-CM

## 2021-03-25 DIAGNOSIS — S0012XA Contusion of left eyelid and periocular area, initial encounter: Secondary | ICD-10-CM | POA: Insufficient documentation

## 2021-03-25 DIAGNOSIS — Z20822 Contact with and (suspected) exposure to covid-19: Secondary | ICD-10-CM | POA: Insufficient documentation

## 2021-03-25 DIAGNOSIS — Z7901 Long term (current) use of anticoagulants: Secondary | ICD-10-CM | POA: Insufficient documentation

## 2021-03-25 DIAGNOSIS — T40411A Poisoning by fentanyl or fentanyl analogs, accidental (unintentional), initial encounter: Principal | ICD-10-CM | POA: Insufficient documentation

## 2021-03-25 DIAGNOSIS — F17200 Nicotine dependence, unspecified, uncomplicated: Secondary | ICD-10-CM | POA: Insufficient documentation

## 2021-03-25 LAB — COMPREHENSIVE METABOLIC PANEL
ALT: 60 U/L — ABNORMAL HIGH (ref 0–44)
AST: 47 U/L — ABNORMAL HIGH (ref 15–41)
Albumin: 3.7 g/dL (ref 3.5–5.0)
Alkaline Phosphatase: 54 U/L (ref 38–126)
Anion gap: 9 (ref 5–15)
BUN: 11 mg/dL (ref 6–20)
CO2: 24 mmol/L (ref 22–32)
Calcium: 8.2 mg/dL — ABNORMAL LOW (ref 8.9–10.3)
Chloride: 104 mmol/L (ref 98–111)
Creatinine, Ser: 0.97 mg/dL (ref 0.61–1.24)
GFR, Estimated: >60 mL/min (ref >60)
Glucose, Bld: 118 mg/dL — ABNORMAL HIGH (ref 70–99)
Potassium: 3.7 mmol/L (ref 3.5–5.1)
Sodium: 137 mmol/L (ref 135–145)
Total Bilirubin: 0.8 mg/dL (ref 0.3–1.2)
Total Protein: 6.2 g/dL — ABNORMAL LOW (ref 6.5–8.1)

## 2021-03-25 LAB — RESP PANEL BY RT-PCR (FLU A&B, COVID) ARPGX2
Influenza A by PCR: NEGATIVE
Influenza B by PCR: NEGATIVE
SARS Coronavirus 2 by RT PCR: NEGATIVE

## 2021-03-25 LAB — CBC WITH DIFFERENTIAL/PLATELET
Abs Immature Granulocytes: 0.09 10*3/uL — ABNORMAL HIGH (ref 0.00–0.07)
Basophils Absolute: 0 10*3/uL (ref 0.0–0.1)
Basophils Relative: 0 %
Eosinophils Absolute: 0.1 10*3/uL (ref 0.0–0.5)
Eosinophils Relative: 1 %
HCT: 44.8 % (ref 39.0–52.0)
Hemoglobin: 15.1 g/dL (ref 13.0–17.0)
Immature Granulocytes: 1 %
Lymphocytes Relative: 14 %
Lymphs Abs: 1.9 10*3/uL (ref 0.7–4.0)
MCH: 30.1 pg (ref 26.0–34.0)
MCHC: 33.7 g/dL (ref 30.0–36.0)
MCV: 89.4 fL (ref 80.0–100.0)
Monocytes Absolute: 0.8 10*3/uL (ref 0.1–1.0)
Monocytes Relative: 6 %
Neutro Abs: 10.9 10*3/uL — ABNORMAL HIGH (ref 1.7–7.7)
Neutrophils Relative %: 78 %
Platelets: 184 10*3/uL (ref 150–400)
RBC: 5.01 MIL/uL (ref 4.22–5.81)
RDW: 12.4 % (ref 11.5–15.5)
WBC: 13.8 10*3/uL — ABNORMAL HIGH (ref 4.0–10.5)
nRBC: 0 % (ref 0.0–0.2)

## 2021-03-25 LAB — CBG MONITORING, ED: Glucose-Capillary: 113 mg/dL — ABNORMAL HIGH (ref 70–99)

## 2021-03-25 LAB — ETHANOL: Alcohol, Ethyl (B): <10 mg/dL (ref <10)

## 2021-03-25 MED ORDER — POLYETHYLENE GLYCOL 3350 17 G PO PACK: 17.0000 g | PACK | Freq: Every day | ORAL | Status: DC | PRN

## 2021-03-25 MED ORDER — ACETAMINOPHEN 650 MG RE SUPP: 650.0000 mg | Freq: Four times a day (QID) | RECTAL | Status: DC | PRN

## 2021-03-25 MED ORDER — NALOXONE HCL 0.4 MG/ML IJ SOLN: 0.4000 mg | INTRAMUSCULAR | Status: DC | PRN

## 2021-03-25 MED ORDER — ACETAMINOPHEN 325 MG PO TABS: 650.0000 mg | ORAL_TABLET | Freq: Four times a day (QID) | ORAL | Status: DC | PRN

## 2021-03-25 MED ORDER — SODIUM CHLORIDE 0.9% FLUSH: 3.0000 mL | Freq: Two times a day (BID) | INTRAVENOUS | Status: DC

## 2021-03-25 MED ORDER — ENOXAPARIN SODIUM 40 MG/0.4ML IJ SOSY: 40.0000 mg | PREFILLED_SYRINGE | Freq: Every day | INTRAMUSCULAR | Status: DC

## 2021-03-25 NOTE — ED Provider Notes (Signed)
[facility] EMERGENCY DEPARTMENT Provider Note   CSN: [id number] Arrival date & time: 03/25/21  2148     History Chief Complaint  Patient presents with   Drug Overdose    Joel Shaw is a 25 y.o. male.  HPI 25 year old male presents with fentanyl overdose.  History is from patient and nurse.  Patient reportedly was found in respiratory arrest by EMS after someone called 911.  Is unclear how long he was down.  He endorses snorting fentanyl and states he does is a couple times a week.  He was not trying to overdose and has no suicidal intent.  He was given Narcan at around 9 PM.  He is having some pain and swelling to his right face and periorbital region though he does not remember how he was injured. Denies any recent illness.  No past medical history on file.  There are no problems to display for this patient.   No past surgical history on file.     No family history on file.  Social History   Tobacco Use   Smoking status: Every Day   Smokeless tobacco: Never  Substance Use Topics   Alcohol use: Yes   Drug use: Not Currently    Types: Methamphetamines, Cocaine    Comment: Meth, Heroin, crack- denies current use 03/05/19    Home Medications Prior to Admission medications   Medication Sig Start Date End Date Taking? Authorizing Provider  ARIPiprazole (ABILIFY) 5 MG tablet Take 5 mg by mouth daily.    [provider]  hydrOXYzine (ATARAX/VISTARIL) 50 MG tablet Take 50 mg by mouth 3 (three) times daily as needed.    [provider]  naloxone [facility]) nasal spray 4 mg/0.1 mL Use after overdose with narcotic. 07/16/20   Wynetta Fines, MD  naltrexone (DEPADE) 50 MG tablet Take by mouth daily.    [provider]  traZODone (DESYREL) 100 MG tablet Take 100 mg by mouth at bedtime.    [provider]  apixaban (ELIQUIS) 5 MG TABS tablet Take 1 tablet (5 mg total) by mouth 2 (two) times daily. 07/19/20 07/19/20  Dione Booze, MD  dicyclomine (BENTYL) 20 MG tablet Take 1 tablet (20 mg total) by mouth every 6 (six) hours as needed for spasms (abdominal pain). Patient not taking: Reported on 07/16/2020 03/30/19 07/19/20  Cristina Gong, PA-C    Allergies    Patient has no known allergies.  Review of Systems   Review of Systems  Constitutional:  Negative for fever.  Respiratory:  Negative for cough.   Psychiatric/Behavioral:  Negative for self-injury and suicidal ideas.    Physical Exam Updated Vital Signs BP 115/77    Pulse 74    Temp 98.3 F (36.8 C) (Oral)    Resp 14    SpO2 99%   Physical Exam Vitals and nursing note reviewed.  Constitutional:      Appearance: He is well-developed.  HENT:     Head: Normocephalic.     Comments: Ecchymosis and mild swelling to left periorbital region and proximal mandible. Mild tenderness    Right Ear: External ear normal.     Left Ear: External ear normal.     Nose: Nose normal.  Eyes:     General:        Right eye: No discharge.        Left eye: No discharge.     Extraocular Movements: Extraocular movements intact.     Pupils: Pupils are  equal, round, and reactive to light.  Cardiovascular:     Rate and Rhythm: Normal rate and regular rhythm.     Heart sounds: Normal heart sounds.  Pulmonary:     Effort: Pulmonary effort is normal.     Breath sounds: Rales present.  Abdominal:     General: There is no distension.     Palpations: Abdomen is soft.     Tenderness: There is no abdominal tenderness.  Musculoskeletal:     Cervical back: Neck supple.  Skin:    General: Skin is warm and dry.  Neurological:     Comments: Patient is asleep but easily awakens to voice and is able to talk to me and carry on a conversation.  If left alone he will fairly quickly fall back asleep but maintains his airway.  Grossly normal strength in all 4 extremities.  He is alert and oriented to person, place, time, situation.  Psychiatric:        Mood and Affect: Mood is  not anxious.    ED Results / Procedures / Treatments   Labs (all labs ordered are listed, but only abnormal results are displayed) Labs Reviewed  COMPREHENSIVE METABOLIC PANEL - Abnormal; Notable for the following components:      Result Value   Glucose, Bld 118 (*)    Calcium 8.2 (*)    Total Protein 6.2 (*)    AST 47 (*)    ALT 60 (*)    All other components within normal limits  CBC WITH DIFFERENTIAL/PLATELET - Abnormal; Notable for the following components:   WBC 13.8 (*)    Neutro Abs 10.9 (*)    Abs Immature Granulocytes 0.09 (*)    All other components within normal limits  CBG MONITORING, ED - Abnormal; Notable for the following components:   Glucose-Capillary 113 (*)    All other components within normal limits  RESP PANEL BY RT-PCR (FLU A&B, COVID) [id number]  ETHANOL  I-STAT ARTERIAL BLOOD GAS, ED    EKG EKG Interpretation  Date/Time:  Friday March 25 2021 22:44:53 EST Ventricular Rate:  78 PR Interval:  147 QRS Duration: 103 QT Interval:  381 QTC Calculation: 434 R Axis:   86 Text Interpretation: Sinus rhythm Borderline ST elevation, anterolateral leads Baseline wander in lead(s) II III aVF similar to Mar 13 2021 Confirmed by Sherwood Gambler [phone]) on 03/25/2021 10:52:53 PM  Radiology CT Head Wo Contrast  Result Date: 03/25/2021 CLINICAL DATA:  Head trauma, skull fracture or hematoma (Age 92-64y); Polytrauma, blunt; Facial trauma, blunt. EXAM: CT HEAD WITHOUT CONTRAST CT MAXILLOFACIAL WITHOUT CONTRAST CT CERVICAL SPINE WITHOUT CONTRAST TECHNIQUE: Multidetector CT imaging of the head, cervical spine, and maxillofacial structures were performed using the standard protocol without intravenous contrast. Multiplanar CT image reconstructions of the cervical spine and maxillofacial structures were also generated. COMPARISON:  None. FINDINGS: CT HEAD FINDINGS Brain: Normal anatomic configuration. No abnormal intra or extra-axial mass lesion or fluid collection. No  abnormal mass effect or midline shift. No evidence of acute intracranial hemorrhage or infarct. Ventricular size is normal. Cerebellum unremarkable. Vascular: Unremarkable Skull: Intact Other: There is fluid opacification of the right mastoid air cells and middle ear cavity. No definite fracture identified. No osseous erosion. Left mastoid air cells and middle ear cavity are clear. CT MAXILLOFACIAL FINDINGS Osseous: No acute facial fracture Orbits: Negative. No traumatic or inflammatory finding. Sinuses: Clear. Soft tissues: There is moderate right facial soft tissue swelling superficial to the right zygoma, right masseter, and right mandible.  CT CERVICAL SPINE FINDINGS Alignment: Normal. Skull base and vertebrae: No acute fracture. No primary bone lesion or focal pathologic process. Soft tissues and spinal canal: No prevertebral fluid or swelling. No visible canal hematoma. Disc levels: Intervertebral disc height is preserved. Prevertebral soft tissues are not thickened. Review of the axial images demonstrates no significant neuroforaminal narrowing or canal stenosis. Upper chest: There is extensive airspace infiltrate within the visualized lung apices which may relate to acute pulmonary hemorrhage, infection, or pulmonary edema. Other: None IMPRESSION: No acute intracranial injury.  No calvarial fracture. Fluid opacification of the right mastoid air cells and middle ear cavity. No definite fracture. Clinical correlation for signs and symptoms of otomastoiditis may be helpful. Right facial soft tissue swelling.  No facial fracture. No acute fracture or listhesis of the cervical spine. Extensive airspace infiltrate within the visualized lung apices, nonspecific. Electronically Signed   By: Fidela Salisbury M.D.   On: 03/25/2021 23:27   CT Cervical Spine Wo Contrast  Result Date: 03/25/2021 CLINICAL DATA:  Head trauma, skull fracture or hematoma (Age 44-64y); Polytrauma, blunt; Facial trauma, blunt. EXAM: CT HEAD  WITHOUT CONTRAST CT MAXILLOFACIAL WITHOUT CONTRAST CT CERVICAL SPINE WITHOUT CONTRAST TECHNIQUE: Multidetector CT imaging of the head, cervical spine, and maxillofacial structures were performed using the standard protocol without intravenous contrast. Multiplanar CT image reconstructions of the cervical spine and maxillofacial structures were also generated. COMPARISON:  None. FINDINGS: CT HEAD FINDINGS Brain: Normal anatomic configuration. No abnormal intra or extra-axial mass lesion or fluid collection. No abnormal mass effect or midline shift. No evidence of acute intracranial hemorrhage or infarct. Ventricular size is normal. Cerebellum unremarkable. Vascular: Unremarkable Skull: Intact Other: There is fluid opacification of the right mastoid air cells and middle ear cavity. No definite fracture identified. No osseous erosion. Left mastoid air cells and middle ear cavity are clear. CT MAXILLOFACIAL FINDINGS Osseous: No acute facial fracture Orbits: Negative. No traumatic or inflammatory finding. Sinuses: Clear. Soft tissues: There is moderate right facial soft tissue swelling superficial to the right zygoma, right masseter, and right mandible. CT CERVICAL SPINE FINDINGS Alignment: Normal. Skull base and vertebrae: No acute fracture. No primary bone lesion or focal pathologic process. Soft tissues and spinal canal: No prevertebral fluid or swelling. No visible canal hematoma. Disc levels: Intervertebral disc height is preserved. Prevertebral soft tissues are not thickened. Review of the axial images demonstrates no significant neuroforaminal narrowing or canal stenosis. Upper chest: There is extensive airspace infiltrate within the visualized lung apices which may relate to acute pulmonary hemorrhage, infection, or pulmonary edema. Other: None IMPRESSION: No acute intracranial injury.  No calvarial fracture. Fluid opacification of the right mastoid air cells and middle ear cavity. No definite fracture. Clinical  correlation for signs and symptoms of otomastoiditis may be helpful. Right facial soft tissue swelling.  No facial fracture. No acute fracture or listhesis of the cervical spine. Extensive airspace infiltrate within the visualized lung apices, nonspecific. Electronically Signed   By: Fidela Salisbury M.D.   On: 03/25/2021 23:27   DG Chest Portable 1 View  Result Date: 03/25/2021 CLINICAL DATA:  Hypoxia.  Overdose. EXAM: PORTABLE CHEST 1 VIEW COMPARISON:  07/19/2020 FINDINGS: The heart size and mediastinal contours are within normal limits. Both lungs are clear. The visualized skeletal structures are unremarkable. IMPRESSION: No active disease. Electronically Signed   By: Lucienne Capers M.D.   On: 03/25/2021 22:31   CT Maxillofacial Wo Contrast  Result Date: 03/25/2021 CLINICAL DATA:  Head trauma, skull fracture or hematoma (  Age 75-64y); Polytrauma, blunt; Facial trauma, blunt. EXAM: CT HEAD WITHOUT CONTRAST CT MAXILLOFACIAL WITHOUT CONTRAST CT CERVICAL SPINE WITHOUT CONTRAST TECHNIQUE: Multidetector CT imaging of the head, cervical spine, and maxillofacial structures were performed using the standard protocol without intravenous contrast. Multiplanar CT image reconstructions of the cervical spine and maxillofacial structures were also generated. COMPARISON:  None. FINDINGS: CT HEAD FINDINGS Brain: Normal anatomic configuration. No abnormal intra or extra-axial mass lesion or fluid collection. No abnormal mass effect or midline shift. No evidence of acute intracranial hemorrhage or infarct. Ventricular size is normal. Cerebellum unremarkable. Vascular: Unremarkable Skull: Intact Other: There is fluid opacification of the right mastoid air cells and middle ear cavity. No definite fracture identified. No osseous erosion. Left mastoid air cells and middle ear cavity are clear. CT MAXILLOFACIAL FINDINGS Osseous: No acute facial fracture Orbits: Negative. No traumatic or inflammatory finding. Sinuses: Clear.  Soft tissues: There is moderate right facial soft tissue swelling superficial to the right zygoma, right masseter, and right mandible. CT CERVICAL SPINE FINDINGS Alignment: Normal. Skull base and vertebrae: No acute fracture. No primary bone lesion or focal pathologic process. Soft tissues and spinal canal: No prevertebral fluid or swelling. No visible canal hematoma. Disc levels: Intervertebral disc height is preserved. Prevertebral soft tissues are not thickened. Review of the axial images demonstrates no significant neuroforaminal narrowing or canal stenosis. Upper chest: There is extensive airspace infiltrate within the visualized lung apices which may relate to acute pulmonary hemorrhage, infection, or pulmonary edema. Other: None IMPRESSION: No acute intracranial injury.  No calvarial fracture. Fluid opacification of the right mastoid air cells and middle ear cavity. No definite fracture. Clinical correlation for signs and symptoms of otomastoiditis may be helpful. Right facial soft tissue swelling.  No facial fracture. No acute fracture or listhesis of the cervical spine. Extensive airspace infiltrate within the visualized lung apices, nonspecific. Electronically Signed   By: Fidela Salisbury M.D.   On: 03/25/2021 23:27    Procedures Procedures   Medications Ordered in ED Medications  naloxone (NARCAN) injection 0.4 mg (has no administration in time range)    ED Course  I have reviewed the triage vital signs and the nursing notes.  Pertinent labs & imaging results that were available during my care of the patient were reviewed by me and considered in my medical decision making (see chart for details).    MDM Rules/Calculators/A&P                         Patient is sleepy but easily awakens.  Even when sleeping he is breathing about 14 times a minute.  I do not think he needs repeated Narcan doses.  However he is quite hypoxic.  On a nasal cannula he dropped down into the 60s and had to be put  on nonrebreather.  Respiratory was able to put him on high flow nasal cannula and we will wean as tolerated.  While his chest x-ray is clear he does have crackles bilaterally and a CT of his head/face/T-spine was obtained given the trauma and the lung windows on the C-spine do show some nonspecific infiltrate which I suspect is pulmonary edema from his respiratory arrest that was rescued via Narcan.  He will need admission for continued respiratory support.  COVID is negative.  He denies any preceding illness.  At this point, he will need admission and close monitoring. Hospitalist will admit.   Of note there is a question of mastoid abnormality on  the CT but on reexamination there is no tenderness or redness to the mastoid and his ear overall looks benign.    Final Clinical Impression(s) / ED Diagnoses Final diagnoses:  Opiate overdose, accidental or unintentional, initial encounter ([place])  Acute respiratory failure with hypoxia [facility])    Rx / DC Orders ED Discharge Orders     None        Sherwood Gambler, MD 03/25/21 2351

## 2021-03-25 NOTE — ED Notes (Signed)
Pt placed on NRB, O2 sats improved to 100%

## 2021-03-25 NOTE — ED Triage Notes (Signed)
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 Resp arrest on EMS arrival, 0.5mg  Narcan 2108 in 18RAC.   CBG 260 151/96 HR 99 97% 4L [place]

## 2021-03-25 NOTE — Progress Notes (Signed)
Pt originally placed on 100% NRB, switched to 10L [place], and is currently on 8L [place] with SpO2 99%. Will continue to wean O2 as tolerated.

## 2021-03-25 NOTE — H&P (Addendum)
History and Physical   Joel Shaw [id number] DOB: September 04, 1995 DOA: 03/25/2021  PCP: Pcp, No   Patient coming from: Brought in by EMS  Chief Complaint: Respiratory failure  HPI: Joel Shaw is a 25 y.o. male with medical history significant of opioid use who presents following opioid overdose and subsequent respiratory failure.  Patient was brought in by EMS who found him in respiratory distress.  9 1 was called by someone and not sure who nor how long patient had been down.  He reportedly snorted fentanyl today which she does a couple times a week.  Denies any intent to overdose or harm himself.  He received Narcan this evening around 9 PM improved some of his symptoms he subsequently had some increasing oxygen requirements.  Also notes some pain and swelling of his face unsure how this happened.  Denies fevers, chills, chest pain, abdominal pain, constipation, diarrhea, nausea, vomiting.  ED Course: Vital signs in the ED were stable other than new oxygen requirement initially on nonrebreather then wean down to 10 L of high flow now 8 L of high flow.  His lab work-up showed CMP with glucose 118, calcium 8.2, protein 6.2, AST and ALT stably elevated at 47 and 60 respectively.  CBC with mild leukocytosis to 13.8.  Respiratory panel for flu and COVID-negative.  Ethanol level negative.  ABG ordered and is pending.  Chest x-ray showed no acute abnormality.  CT head showed no acute abnormality.  CT maxillofacial showed some opacification of the right mastoid air cells cells and middle ear with some focal swelling on the right face.  CT of the C-spine showed no acute abnormality of spine but did show extensive infiltrate in the visualized upper lobes of bilateral lungs.  Review of Systems: As per HPI otherwise all other systems reviewed and are negative.  No past medical history on file.  No past surgical history on file.  Social History  reports that he has been smoking. He has never used  smokeless tobacco. He reports current alcohol use. He reports that he does not currently use drugs after having used the following drugs: Methamphetamines and Cocaine.  No Known Allergies  No family history on file.  Prior to Admission medications   Medication Sig Start Date End Date Taking? Authorizing Provider  ARIPiprazole (ABILIFY) 5 MG tablet Take 5 mg by mouth daily.    [provider]  hydrOXYzine (ATARAX/VISTARIL) 50 MG tablet Take 50 mg by mouth 3 (three) times daily as needed.    [provider]  naloxone [facility]) nasal spray 4 mg/0.1 mL Use after overdose with narcotic. 07/16/20   Valarie Merino, MD  naltrexone (DEPADE) 50 MG tablet Take by mouth daily.    [provider]  traZODone (DESYREL) 100 MG tablet Take 100 mg by mouth at bedtime.    [provider]  apixaban (ELIQUIS) 5 MG TABS tablet Take 1 tablet (5 mg total) by mouth 2 (two) times daily. [id number]  Delora Fuel, MD  dicyclomine (BENTYL) 20 MG tablet Take 1 tablet (20 mg total) by mouth every 6 (six) hours as needed for spasms (abdominal pain). Patient not taking: Reported on 07/16/2020 03/30/19 07/19/20  Lorin Glass, [place]    Physical Exam: Vitals:   03/25/21 2230 03/25/21 2320 03/25/21 2330 03/25/21 2345  BP: 118/78 (!) 119/91 115/77   Pulse: 72 100 74   Resp: 14 (!) 21 14   Temp:      TempSrc:  SpO2: 100% 98% 99% 99%   Physical Exam Constitutional:      General: He is not in acute distress.    Appearance: Normal appearance.  HENT:     Head: Normocephalic and atraumatic.     Mouth/Throat:     Mouth: Mucous membranes are moist.     Pharynx: Oropharynx is clear.  Eyes:     Extraocular Movements: Extraocular movements intact.     Pupils: Pupils are equal, round, and reactive to light.  Cardiovascular:     Rate and Rhythm: Normal rate and regular rhythm.     Pulses: Normal pulses.     Heart sounds: Normal heart sounds.  Pulmonary:     Effort: Pulmonary  effort is normal. No respiratory distress.     Breath sounds: Rales present.  Abdominal:     General: Bowel sounds are normal. There is no distension.     Palpations: Abdomen is soft.     Tenderness: There is no abdominal tenderness.  Musculoskeletal:        General: No swelling or deformity.  Skin:    General: Skin is warm and dry.  Neurological:     General: No focal deficit present.     Comments: Allergic but arousable to voice, oriented to person place and month was off on the day by 1 day.   Labs on Admission: I have personally reviewed following labs and imaging studies  CBC: Recent Labs  Lab 03/25/21 2208  WBC 13.8*  NEUTROABS 10.9*  HGB 15.1  HCT 44.8  MCV 89.4  PLT 184    Basic Metabolic Panel: Recent Labs  Lab 03/25/21 2208  NA 137  K 3.7  CL 104  CO2 24  GLUCOSE 118*  BUN 11  CREATININE 0.97  CALCIUM 8.2*    GFR: Estimated Creatinine Clearance: 130.7 mL/min (by C-G formula based on SCr of 0.97 mg/dL).  Liver Function Tests: Recent Labs  Lab 03/25/21 2208  AST 47*  ALT 60*  ALKPHOS 54  BILITOT 0.8  PROT 6.2*  ALBUMIN 3.7    Urine analysis:    Component Value Date/Time   COLORURINE YELLOW 07/19/2020 0330   APPEARANCEUR HAZY (A) 07/19/2020 0330   LABSPEC 1.013 07/19/2020 0330   PHURINE 6.0 07/19/2020 0330   GLUCOSEU >=500 (A) 07/19/2020 0330   HGBUR NEGATIVE 07/19/2020 0330   BILIRUBINUR NEGATIVE 07/19/2020 0330   KETONESUR 5 (A) 07/19/2020 0330   PROTEINUR 30 (A) 07/19/2020 0330   NITRITE NEGATIVE 07/19/2020 0330   LEUKOCYTESUR NEGATIVE 07/19/2020 0330    Radiological Exams on Admission: CT Head Wo Contrast  Result Date: 03/25/2021 CLINICAL DATA:  Head trauma, skull fracture or hematoma (Age 42-64y); Polytrauma, blunt; Facial trauma, blunt. EXAM: CT HEAD WITHOUT CONTRAST CT MAXILLOFACIAL WITHOUT CONTRAST CT CERVICAL SPINE WITHOUT CONTRAST TECHNIQUE: Multidetector CT imaging of the head, cervical spine, and maxillofacial structures  were performed using the standard protocol without intravenous contrast. Multiplanar CT image reconstructions of the cervical spine and maxillofacial structures were also generated. COMPARISON:  None. FINDINGS: CT HEAD FINDINGS Brain: Normal anatomic configuration. No abnormal intra or extra-axial mass lesion or fluid collection. No abnormal mass effect or midline shift. No evidence of acute intracranial hemorrhage or infarct. Ventricular size is normal. Cerebellum unremarkable. Vascular: Unremarkable Skull: Intact Other: There is fluid opacification of the right mastoid air cells and middle ear cavity. No definite fracture identified. No osseous erosion. Left mastoid air cells and middle ear cavity are clear. CT MAXILLOFACIAL FINDINGS Osseous: No acute facial fracture  Orbits: Negative. No traumatic or inflammatory finding. Sinuses: Clear. Soft tissues: There is moderate right facial soft tissue swelling superficial to the right zygoma, right masseter, and right mandible. CT CERVICAL SPINE FINDINGS Alignment: Normal. Skull base and vertebrae: No acute fracture. No primary bone lesion or focal pathologic process. Soft tissues and spinal canal: No prevertebral fluid or swelling. No visible canal hematoma. Disc levels: Intervertebral disc height is preserved. Prevertebral soft tissues are not thickened. Review of the axial images demonstrates no significant neuroforaminal narrowing or canal stenosis. Upper chest: There is extensive airspace infiltrate within the visualized lung apices which may relate to acute pulmonary hemorrhage, infection, or pulmonary edema. Other: None IMPRESSION: No acute intracranial injury.  No calvarial fracture. Fluid opacification of the right mastoid air cells and middle ear cavity. No definite fracture. Clinical correlation for signs and symptoms of otomastoiditis may be helpful. Right facial soft tissue swelling.  No facial fracture. No acute fracture or listhesis of the cervical spine.  Extensive airspace infiltrate within the visualized lung apices, nonspecific. Electronically Signed   By: Fidela Salisbury M.D.   On: 03/25/2021 23:27   CT Cervical Spine Wo Contrast  Result Date: 03/25/2021 CLINICAL DATA:  Head trauma, skull fracture or hematoma (Age 41-64y); Polytrauma, blunt; Facial trauma, blunt. EXAM: CT HEAD WITHOUT CONTRAST CT MAXILLOFACIAL WITHOUT CONTRAST CT CERVICAL SPINE WITHOUT CONTRAST TECHNIQUE: Multidetector CT imaging of the head, cervical spine, and maxillofacial structures were performed using the standard protocol without intravenous contrast. Multiplanar CT image reconstructions of the cervical spine and maxillofacial structures were also generated. COMPARISON:  None. FINDINGS: CT HEAD FINDINGS Brain: Normal anatomic configuration. No abnormal intra or extra-axial mass lesion or fluid collection. No abnormal mass effect or midline shift. No evidence of acute intracranial hemorrhage or infarct. Ventricular size is normal. Cerebellum unremarkable. Vascular: Unremarkable Skull: Intact Other: There is fluid opacification of the right mastoid air cells and middle ear cavity. No definite fracture identified. No osseous erosion. Left mastoid air cells and middle ear cavity are clear. CT MAXILLOFACIAL FINDINGS Osseous: No acute facial fracture Orbits: Negative. No traumatic or inflammatory finding. Sinuses: Clear. Soft tissues: There is moderate right facial soft tissue swelling superficial to the right zygoma, right masseter, and right mandible. CT CERVICAL SPINE FINDINGS Alignment: Normal. Skull base and vertebrae: No acute fracture. No primary bone lesion or focal pathologic process. Soft tissues and spinal canal: No prevertebral fluid or swelling. No visible canal hematoma. Disc levels: Intervertebral disc height is preserved. Prevertebral soft tissues are not thickened. Review of the axial images demonstrates no significant neuroforaminal narrowing or canal stenosis. Upper chest:  There is extensive airspace infiltrate within the visualized lung apices which may relate to acute pulmonary hemorrhage, infection, or pulmonary edema. Other: None IMPRESSION: No acute intracranial injury.  No calvarial fracture. Fluid opacification of the right mastoid air cells and middle ear cavity. No definite fracture. Clinical correlation for signs and symptoms of otomastoiditis may be helpful. Right facial soft tissue swelling.  No facial fracture. No acute fracture or listhesis of the cervical spine. Extensive airspace infiltrate within the visualized lung apices, nonspecific. Electronically Signed   By: Fidela Salisbury M.D.   On: 03/25/2021 23:27   DG Chest Portable 1 View  Result Date: 03/25/2021 CLINICAL DATA:  Hypoxia.  Overdose. EXAM: PORTABLE CHEST 1 VIEW COMPARISON:  07/19/2020 FINDINGS: The heart size and mediastinal contours are within normal limits. Both lungs are clear. The visualized skeletal structures are unremarkable. IMPRESSION: No active disease. Electronically Signed  By: Lucienne Capers M.D.   On: 03/25/2021 22:31   CT Maxillofacial Wo Contrast  Result Date: 03/25/2021 CLINICAL DATA:  Head trauma, skull fracture or hematoma (Age 41-64y); Polytrauma, blunt; Facial trauma, blunt. EXAM: CT HEAD WITHOUT CONTRAST CT MAXILLOFACIAL WITHOUT CONTRAST CT CERVICAL SPINE WITHOUT CONTRAST TECHNIQUE: Multidetector CT imaging of the head, cervical spine, and maxillofacial structures were performed using the standard protocol without intravenous contrast. Multiplanar CT image reconstructions of the cervical spine and maxillofacial structures were also generated. COMPARISON:  None. FINDINGS: CT HEAD FINDINGS Brain: Normal anatomic configuration. No abnormal intra or extra-axial mass lesion or fluid collection. No abnormal mass effect or midline shift. No evidence of acute intracranial hemorrhage or infarct. Ventricular size is normal. Cerebellum unremarkable. Vascular: Unremarkable Skull: Intact  Other: There is fluid opacification of the right mastoid air cells and middle ear cavity. No definite fracture identified. No osseous erosion. Left mastoid air cells and middle ear cavity are clear. CT MAXILLOFACIAL FINDINGS Osseous: No acute facial fracture Orbits: Negative. No traumatic or inflammatory finding. Sinuses: Clear. Soft tissues: There is moderate right facial soft tissue swelling superficial to the right zygoma, right masseter, and right mandible. CT CERVICAL SPINE FINDINGS Alignment: Normal. Skull base and vertebrae: No acute fracture. No primary bone lesion or focal pathologic process. Soft tissues and spinal canal: No prevertebral fluid or swelling. No visible canal hematoma. Disc levels: Intervertebral disc height is preserved. Prevertebral soft tissues are not thickened. Review of the axial images demonstrates no significant neuroforaminal narrowing or canal stenosis. Upper chest: There is extensive airspace infiltrate within the visualized lung apices which may relate to acute pulmonary hemorrhage, infection, or pulmonary edema. Other: None IMPRESSION: No acute intracranial injury.  No calvarial fracture. Fluid opacification of the right mastoid air cells and middle ear cavity. No definite fracture. Clinical correlation for signs and symptoms of otomastoiditis may be helpful. Right facial soft tissue swelling.  No facial fracture. No acute fracture or listhesis of the cervical spine. Extensive airspace infiltrate within the visualized lung apices, nonspecific. Electronically Signed   By: Fidela Salisbury M.D.   On: 03/25/2021 23:27    EKG: Independently reviewed.  Sinus rhythm at 78 bpm.  Baseline wander in inferior leads.  Similar to previous.  Assessment/Plan Principal Problem:   Acute respiratory failure with hypoxia (HCC) Active Problems:   Opioid use disorder  Acute respiratory failure with hypoxia > Initially was called out due to being in respiratory distress-unresponsive when  someone called 911 after he accidentally overdosed on fentanyl. > More alert after receiving Narcan however has had continued oxygen requirement > Chest x-ray showed no acute Hilda Blades however CT of the C-spine showed extensive infiltrate in the visualized upper lobes bilaterally. > Concerned that this may represent Narcan induced pulmonary edema which is a known entity though not common. > Patient was weaned from nonrebreather to 10 L high flow to 8 L of high flow. - Monitor on progressive unit - Make BiPAP available as needed (for increasing oxygen requirements or hypercarbia) - Follow-up ABG as ordered in the ED - Wean O2 as tolerated - Will give one-time dose of IV Lasix  Opioid use disorder Opioid overdose > Only drowsy but arousable.  Had suspected post Narcan pulmonary edema as above. - Counseled on cessation - Narcan as needed  DVT prophylaxis: Lovenox  Code Status:   Full  Family Communication:  None on admission.  Patient would not like anyone to be called at this time. Disposition Plan:   Patient  is from:  Home  Anticipated DC to:  Home  Anticipated DC date:  1 to 2 days  Anticipated DC barriers: None  Consults called:  None Admission status:  Observation, progressive   Severity of Illness: The appropriate patient status for this patient is OBSERVATION. Observation status is judged to be reasonable and necessary in order to provide the required intensity of service to ensure the patient's safety. The patient's presenting symptoms, physical exam findings, and initial radiographic and laboratory data in the context of their medical condition is felt to place them at decreased risk for further clinical deterioration. Furthermore, it is anticipated that the patient will be medically stable for discharge from the hospital within 2 midnights of admission.    Marcelyn Bruins MD [facility]  How to contact the [facility] Attending or Consulting provider [place] or covering  provider during after hours [place], for this patient?   Check the care team in [facility] and look for a) attending/consulting [facility] provider listed and b) the [facility] team listed Log into [email or web address] and use [place]'s universal password to access. If you do not have the password, please contact the hospital operator. Locate the [facility] provider you are looking for under [facility] and page to a number that you can be directly reached. If you still have difficulty reaching the provider, please page the [facility] (Director on Call) for the Hospitalists listed on amion for assistance.  03/26/2021, 12:00 AM

## 2021-03-26 ENCOUNTER — Other Ambulatory Visit: Payer: Self-pay | Admitting: Internal Medicine

## 2021-03-26 DIAGNOSIS — F1190 Opioid use, unspecified, uncomplicated: Secondary | ICD-10-CM

## 2021-03-26 MED ORDER — NICOTINE 21 MG/24HR TD PT24: 21.0000 mg | MEDICATED_PATCH | Freq: Every day | TRANSDERMAL | Status: DC

## 2021-03-26 MED ORDER — FUROSEMIDE 10 MG/ML IJ SOLN
40.0000 mg | Freq: Once | INTRAMUSCULAR | Status: AC
  Administered 2021-03-26: 40 mg via INTRAVENOUS

## 2021-03-26 NOTE — Progress Notes (Signed)
[facility] night cross cover note:  I was notified by RN that this patient has left AMA, and refused to sign the AMA form.     Newton Pigg, DO Hospitalist

## 2021-03-26 NOTE — Progress Notes (Signed)
Spoke with admitting MD. Molli Knock to collect VBG at this time to rule out hypercarbia. Will reassess need for ABG pending any increased O2 requirement. RT will continue to monitor and wean O2 as tolerated.

## 2021-04-01 ENCOUNTER — Emergency Department (HOSPITAL_COMMUNITY)
Admission: EM | Admit: 2021-04-01 | Discharge: 2021-04-01 | Disposition: A | Discharge status: Home / Self Care | Payer: Medicaid - Out of State | Attending: Emergency Medicine | Admitting: Emergency Medicine

## 2021-04-01 ENCOUNTER — Other Ambulatory Visit: Payer: Self-pay

## 2021-04-01 DIAGNOSIS — T402X1A Poisoning by other opioids, accidental (unintentional), initial encounter: Secondary | ICD-10-CM | POA: Insufficient documentation

## 2021-04-01 DIAGNOSIS — X58XXXA Exposure to other specified factors, initial encounter: Secondary | ICD-10-CM | POA: Diagnosis not present

## 2021-04-01 DIAGNOSIS — T50901A Poisoning by unspecified drugs, medicaments and biological substances, accidental (unintentional), initial encounter: Secondary | ICD-10-CM | POA: Diagnosis present

## 2021-04-01 DIAGNOSIS — T40601A Poisoning by unspecified narcotics, accidental (unintentional), initial encounter: Secondary | ICD-10-CM

## 2021-04-01 NOTE — ED Triage Notes (Signed)
Patient BIBA for opiate OD. Patient states they took it nasally. Patient was found unresponsive and was given 4mg  Narcan IN by [employer]. [employer] states it took 10 min for patient to become responsive.  Patient has Hx of opiate OD  BP: 134/90 HR: 88 RR: 14 SPO2: 98

## 2021-04-01 NOTE — ED Provider Notes (Signed)
[facility]-EMERGENCY DEPT Provider Note   CSN: [id number] Arrival date & time: 04/01/21  1753     History  Chief Complaint  Patient presents with   Drug Overdose   Loss of Consciousness    Chastin Riesgo is a 26 y.o. male.  Patient is a 26 year old male with a history of chronic polysubstance use who is presenting today with police after an intentional overdose.  Patient reports that he was snorting fentanyl tonight and that is all he remembers.  He denies any intention to hurting himself.  Additional information was obtained by the police who report that they were called [place] and not breathing.  When they arrived they administered Narcan and they reported within about 4 5 minutes patient woke up and returned to baseline.  Patient has no complaints at this time.  He is not taking any prescription medications at this time.  He does admit to drinking some alcohol today as well.  The history is provided by the patient and the police.  Drug Overdose  Loss of Consciousness     Home Medications Prior to Admission medications   Medication Sig Start Date End Date Taking? Authorizing Provider  ARIPiprazole (ABILIFY) 5 MG tablet Take 5 mg by mouth daily.    [provider]  hydrOXYzine (ATARAX/VISTARIL) 50 MG tablet Take 50 mg by mouth 3 (three) times daily as needed.    [provider]  naloxone [facility]) nasal spray 4 mg/0.1 mL Use after overdose with narcotic. 07/16/20   Wynetta Fines, MD  naltrexone (DEPADE) 50 MG tablet Take by mouth daily.    [provider]  traZODone (DESYREL) 100 MG tablet Take 100 mg by mouth at bedtime.    [provider]  apixaban (ELIQUIS) 5 MG TABS tablet Take 1 tablet (5 mg total) by mouth 2 (two) times daily. 07/19/20 07/19/20  Dione Booze, MD  dicyclomine (BENTYL) 20 MG tablet Take 1 tablet (20 mg total) by mouth every 6 (six) hours as needed for spasms  (abdominal pain). Patient not taking: Reported on 07/16/2020 03/30/19 07/19/20  Cristina Gong, PA-C      Allergies    Patient has no known allergies.    Review of Systems   Review of Systems  Cardiovascular:  Positive for syncope.  All other systems reviewed and are negative.  Physical Exam Updated Vital Signs BP (!) 110/55    Pulse 97    Temp (!) 97.5 F (36.4 C) (Oral)    Resp (!) 26    SpO2 99%  Physical Exam Vitals and nursing note reviewed.  Constitutional:      General: He is not in acute distress.    Appearance: He is well-developed.     Comments: Disheveled  HENT:     Head: Normocephalic and atraumatic.  Eyes:     Conjunctiva/sclera: Conjunctivae normal.     Pupils: Pupils are equal, round, and reactive to light.  Cardiovascular:     Rate and Rhythm: Normal rate and regular rhythm.     Pulses: Normal pulses.     Heart sounds: No murmur heard. Pulmonary:     Effort: Pulmonary effort is normal. No respiratory distress.     Breath sounds: Normal breath sounds. No wheezing or rales.  Abdominal:     General: There is no distension.     Palpations: Abdomen is soft.     Tenderness: There is no abdominal tenderness. There is no  guarding or rebound.  Musculoskeletal:        General: No tenderness. Normal range of motion.     Cervical back: Normal range of motion and neck supple.  Skin:    General: Skin is warm and dry.     Findings: No erythema or rash.  Neurological:     Mental Status: He is alert and oriented to person, place, and time. Mental status is at baseline.  Psychiatric:        Mood and Affect: Mood normal.        Behavior: Behavior normal.    ED Results / Procedures / Treatments   Labs (all labs ordered are listed, but only abnormal results are displayed) Labs Reviewed - No data to display  EKG None  Radiology No results found.  Procedures Procedures    Medications Ordered in ED Medications - No data to display  ED Course/ Medical  Decision Making/ A&P                           Medical Decision Making Amount and/or Complexity of Data Reviewed Independent Historian:     Details: police External Data Reviewed: notes.   Patient presenting today with unintentional overdose.  Patient was given Narcan prior to arrival and is now awake and alert and at baseline.  Vital signs are reassuring.  Will monitor patient to ensure no return of sedating symptoms.  9:32 PM On reevaluation patient is still awake and alert.  He is in no acute distress.  It is now almost 4 hours after receiving Narcan feel that he is safe for discharge home.        Final Clinical Impression(s) / ED Diagnoses Final diagnoses:  Opiate overdose, accidental or unintentional, initial encounter [facility])    Rx / DC Orders ED Discharge Orders     None         Gwyneth Sprout, MD 04/01/21 2132

## 2021-04-07 NOTE — Discharge Summary (Signed)
PATIENT LEFT AMA  Physician Discharge Summary   Patient name: Joel Shaw  Admit date:     03/25/2021  Discharge date: 04/07/2021  Attending Physician: Marcelyn Bruins [[id number]]  Discharge Physician: Marcelyn Bruins   PCP: Pcp, No    Recommendations at discharge: Patient left AMA.  Recommendation was for patient to remain in hospital.  If having to leave recommendation is for him to follow-up with any recurrent symptoms of shortness of breath.  Also recommend follow-up or establishment with PCP.  Recommendation to work to abstain from opioid use.  Discharge Diagnoses Principal Problem:   Acute respiratory failure with hypoxia (HCC) Active Problems:   Opioid use disorder   Resolved Diagnoses Resolved Problems:   * No resolved hospital problems. [facility] Course   HPI: Joel Shaw is a 26 y.o. male with medical history significant of opioid use who presents following opioid overdose and subsequent respiratory failure.  Patient was brought in by EMS who found him in respiratory distress.  9 1 was called by someone and not sure who nor how long patient had been down.  He reportedly snorted fentanyl today which she does a couple times a week.  Denies any intent to overdose or harm himself.  He received Narcan this evening around 9 PM improved some of his symptoms he subsequently had some increasing oxygen requirements.  Also notes some pain and swelling of his face unsure how this happened.   Denies fevers, chills, chest pain, abdominal pain, constipation, diarrhea, nausea, vomiting.   ED Course: Vital signs in the ED were stable other than new oxygen requirement initially on nonrebreather then wean down to 10 L of high flow now 8 L of high flow.  His lab work-up showed CMP with glucose 118, calcium 8.2, protein 6.2, AST and ALT stably elevated at 47 and 60 respectively.  CBC with mild leukocytosis to 13.8.  Respiratory panel for flu and COVID-negative.  Ethanol level  negative.  ABG ordered and is pending.  Chest x-ray showed no acute abnormality.  CT head showed no acute abnormality.  CT maxillofacial showed some opacification of the right mastoid air cells cells and middle ear with some focal swelling on the right face.  CT of the C-spine showed no acute abnormality of spine but did show extensive infiltrate in the visualized upper lobes of bilateral lungs.   Patient became more alert later in the evening and left AMA without signing paperwork.  Note from cross cover provider states that he was notified by the nurse that the patient had already left and had refused to sign paperwork.  No notes have been filed under this hospital service. Service: Hospitalist  Acute respiratory failure with hypoxia > Initially was called out due to being in respiratory distress-unresponsive when someone called 911 after he accidentally overdosed on fentanyl. > More alert after receiving Narcan however has had continued oxygen requirement > Chest x-ray showed no acute Hilda Blades however CT of the C-spine showed extensive infiltrate in the visualized upper lobes bilaterally. > Concerned that this may represent Narcan induced pulmonary edema which is a known entity though not common. > Patient was weaned from nonrebreather to 10 L high flow to 8 L of high flow. - Monitor on progressive unit - Make BiPAP available as needed  - Wean O2 as tolerated - Gave one-time dose of IV Lasix   Opioid use disorder Opioid overdose > Only drowsy but arousable.  Had suspected post Narcan pulmonary edema  as above. - Counseled on cessation - Narcan as needed     Procedures performed: None  Condition at discharge: stable  Exam Physical Exam not performed on discharge as patient left AMA  Disposition:  Patient left AMA.  Discharge time: less than 30 minutes. Allergies as of 03/26/2021   No Known Allergies      Medication List     ASK your doctor about these medications     ARIPiprazole 5 MG tablet Commonly known as: ABILIFY Take 5 mg by mouth daily.   hydrOXYzine 50 MG tablet Commonly known as: ATARAX Take 50 mg by mouth 3 (three) times daily as needed.   naloxone 4 MG/0.1ML Liqd nasal spray kit Commonly known as: NARCAN Use after overdose with narcotic.   naltrexone 50 MG tablet Commonly known as: DEPADE Take by mouth daily.   traZODone 100 MG tablet Commonly known as: DESYREL Take 100 mg by mouth at bedtime.        CT Head Wo Contrast  Result Date: 03/25/2021 CLINICAL DATA:  Head trauma, skull fracture or hematoma (Age 73-64y); Polytrauma, blunt; Facial trauma, blunt. EXAM: CT HEAD WITHOUT CONTRAST CT MAXILLOFACIAL WITHOUT CONTRAST CT CERVICAL SPINE WITHOUT CONTRAST TECHNIQUE: Multidetector CT imaging of the head, cervical spine, and maxillofacial structures were performed using the standard protocol without intravenous contrast. Multiplanar CT image reconstructions of the cervical spine and maxillofacial structures were also generated. COMPARISON:  None. FINDINGS: CT HEAD FINDINGS Brain: Normal anatomic configuration. No abnormal intra or extra-axial mass lesion or fluid collection. No abnormal mass effect or midline shift. No evidence of acute intracranial hemorrhage or infarct. Ventricular size is normal. Cerebellum unremarkable. Vascular: Unremarkable Skull: Intact Other: There is fluid opacification of the right mastoid air cells and middle ear cavity. No definite fracture identified. No osseous erosion. Left mastoid air cells and middle ear cavity are clear. CT MAXILLOFACIAL FINDINGS Osseous: No acute facial fracture Orbits: Negative. No traumatic or inflammatory finding. Sinuses: Clear. Soft tissues: There is moderate right facial soft tissue swelling superficial to the right zygoma, right masseter, and right mandible. CT CERVICAL SPINE FINDINGS Alignment: Normal. Skull base and vertebrae: No acute fracture. No primary bone lesion or focal  pathologic process. Soft tissues and spinal canal: No prevertebral fluid or swelling. No visible canal hematoma. Disc levels: Intervertebral disc height is preserved. Prevertebral soft tissues are not thickened. Review of the axial images demonstrates no significant neuroforaminal narrowing or canal stenosis. Upper chest: There is extensive airspace infiltrate within the visualized lung apices which may relate to acute pulmonary hemorrhage, infection, or pulmonary edema. Other: None IMPRESSION: No acute intracranial injury.  No calvarial fracture. Fluid opacification of the right mastoid air cells and middle ear cavity. No definite fracture. Clinical correlation for signs and symptoms of otomastoiditis may be helpful. Right facial soft tissue swelling.  No facial fracture. No acute fracture or listhesis of the cervical spine. Extensive airspace infiltrate within the visualized lung apices, nonspecific. Electronically Signed   By: Fidela Salisbury M.D.   On: 03/25/2021 23:27   CT Cervical Spine Wo Contrast  Result Date: 03/25/2021 CLINICAL DATA:  Head trauma, skull fracture or hematoma (Age 26-64y); Polytrauma, blunt; Facial trauma, blunt. EXAM: CT HEAD WITHOUT CONTRAST CT MAXILLOFACIAL WITHOUT CONTRAST CT CERVICAL SPINE WITHOUT CONTRAST TECHNIQUE: Multidetector CT imaging of the head, cervical spine, and maxillofacial structures were performed using the standard protocol without intravenous contrast. Multiplanar CT image reconstructions of the cervical spine and maxillofacial structures were also generated. COMPARISON:  None. FINDINGS: CT  HEAD FINDINGS Brain: Normal anatomic configuration. No abnormal intra or extra-axial mass lesion or fluid collection. No abnormal mass effect or midline shift. No evidence of acute intracranial hemorrhage or infarct. Ventricular size is normal. Cerebellum unremarkable. Vascular: Unremarkable Skull: Intact Other: There is fluid opacification of the right mastoid air cells and  middle ear cavity. No definite fracture identified. No osseous erosion. Left mastoid air cells and middle ear cavity are clear. CT MAXILLOFACIAL FINDINGS Osseous: No acute facial fracture Orbits: Negative. No traumatic or inflammatory finding. Sinuses: Clear. Soft tissues: There is moderate right facial soft tissue swelling superficial to the right zygoma, right masseter, and right mandible. CT CERVICAL SPINE FINDINGS Alignment: Normal. Skull base and vertebrae: No acute fracture. No primary bone lesion or focal pathologic process. Soft tissues and spinal canal: No prevertebral fluid or swelling. No visible canal hematoma. Disc levels: Intervertebral disc height is preserved. Prevertebral soft tissues are not thickened. Review of the axial images demonstrates no significant neuroforaminal narrowing or canal stenosis. Upper chest: There is extensive airspace infiltrate within the visualized lung apices which may relate to acute pulmonary hemorrhage, infection, or pulmonary edema. Other: None IMPRESSION: No acute intracranial injury.  No calvarial fracture. Fluid opacification of the right mastoid air cells and middle ear cavity. No definite fracture. Clinical correlation for signs and symptoms of otomastoiditis may be helpful. Right facial soft tissue swelling.  No facial fracture. No acute fracture or listhesis of the cervical spine. Extensive airspace infiltrate within the visualized lung apices, nonspecific. Electronically Signed   By: Fidela Salisbury M.D.   On: 03/25/2021 23:27   DG Chest Portable 1 View  Result Date: 03/25/2021 CLINICAL DATA:  Hypoxia.  Overdose. EXAM: PORTABLE CHEST 1 VIEW COMPARISON:  07/19/2020 FINDINGS: The heart size and mediastinal contours are within normal limits. Both lungs are clear. The visualized skeletal structures are unremarkable. IMPRESSION: No active disease. Electronically Signed   By: Lucienne Capers M.D.   On: 03/25/2021 22:31   CT Maxillofacial Wo Contrast  Result  Date: 03/25/2021 CLINICAL DATA:  Head trauma, skull fracture or hematoma (Age 82-64y); Polytrauma, blunt; Facial trauma, blunt. EXAM: CT HEAD WITHOUT CONTRAST CT MAXILLOFACIAL WITHOUT CONTRAST CT CERVICAL SPINE WITHOUT CONTRAST TECHNIQUE: Multidetector CT imaging of the head, cervical spine, and maxillofacial structures were performed using the standard protocol without intravenous contrast. Multiplanar CT image reconstructions of the cervical spine and maxillofacial structures were also generated. COMPARISON:  None. FINDINGS: CT HEAD FINDINGS Brain: Normal anatomic configuration. No abnormal intra or extra-axial mass lesion or fluid collection. No abnormal mass effect or midline shift. No evidence of acute intracranial hemorrhage or infarct. Ventricular size is normal. Cerebellum unremarkable. Vascular: Unremarkable Skull: Intact Other: There is fluid opacification of the right mastoid air cells and middle ear cavity. No definite fracture identified. No osseous erosion. Left mastoid air cells and middle ear cavity are clear. CT MAXILLOFACIAL FINDINGS Osseous: No acute facial fracture Orbits: Negative. No traumatic or inflammatory finding. Sinuses: Clear. Soft tissues: There is moderate right facial soft tissue swelling superficial to the right zygoma, right masseter, and right mandible. CT CERVICAL SPINE FINDINGS Alignment: Normal. Skull base and vertebrae: No acute fracture. No primary bone lesion or focal pathologic process. Soft tissues and spinal canal: No prevertebral fluid or swelling. No visible canal hematoma. Disc levels: Intervertebral disc height is preserved. Prevertebral soft tissues are not thickened. Review of the axial images demonstrates no significant neuroforaminal narrowing or canal stenosis. Upper chest: There is extensive airspace infiltrate within the visualized lung  apices which may relate to acute pulmonary hemorrhage, infection, or pulmonary edema. Other: None IMPRESSION: No acute  intracranial injury.  No calvarial fracture. Fluid opacification of the right mastoid air cells and middle ear cavity. No definite fracture. Clinical correlation for signs and symptoms of otomastoiditis may be helpful. Right facial soft tissue swelling.  No facial fracture. No acute fracture or listhesis of the cervical spine. Extensive airspace infiltrate within the visualized lung apices, nonspecific. Electronically Signed   By: Fidela Salisbury M.D.   On: 03/25/2021 23:27   Results for orders placed or performed during the hospital encounter of 03/25/21  Resp Panel by RT-PCR (Flu A&B, Covid) Nasopharyngeal Swab     Status: None   Collection Time: 03/25/21 10:09 PM   Specimen: Nasopharyngeal Swab; Nasopharyngeal(NP) swabs in vial transport medium  Result Value Ref Range Status   SARS Coronavirus 2 by RT PCR NEGATIVE NEGATIVE Final    Comment: (NOTE) SARS-CoV-2 target nucleic acids are NOT DETECTED.  The SARS-CoV-2 RNA is generally detectable in upper respiratory specimens during the acute phase of infection. The lowest concentration of SARS-CoV-2 viral copies this assay can detect is 138 copies/mL. A negative result does not preclude SARS-Cov-2 infection and should not be used as the sole basis for treatment or other patient management decisions. A negative result may occur with  improper specimen collection/handling, submission of specimen other than nasopharyngeal swab, presence of viral mutation(s) within the areas targeted by this assay, and inadequate number of viral copies(<138 copies/mL). A negative result must be combined with clinical observations, patient history, and epidemiological information. The expected result is Negative.  Fact Sheet for Patients:  EntrepreneurPulse.com.au  Fact Sheet for Healthcare Providers:  IncredibleEmployment.be  This test is no t yet approved or cleared by the [place] FDA and  has been authorized for  detection and/or diagnosis of SARS-CoV-2 by FDA under an Emergency Use Authorization (EUA). This EUA will remain  in effect (meaning this test can be used) for the duration of the COVID-19 declaration under Section 564(b)(1) of the Act, 21 U.S.C.section 360bbb-3(b)(1), unless the authorization is terminated  or revoked sooner.       Influenza A by PCR NEGATIVE NEGATIVE Final   Influenza B by PCR NEGATIVE NEGATIVE Final    Comment: (NOTE) The Xpert Xpress SARS-CoV-2/FLU/RSV plus assay is intended as an aid in the diagnosis of influenza from Nasopharyngeal swab specimens and should not be used as a sole basis for treatment. Nasal washings and aspirates are unacceptable for Xpert Xpress SARS-CoV-2/FLU/RSV testing.  Fact Sheet for Patients: EntrepreneurPulse.com.au  Fact Sheet for Healthcare Providers: IncredibleEmployment.be  This test is not yet approved or cleared by the [place] FDA and has been authorized for detection and/or diagnosis of SARS-CoV-2 by FDA under an Emergency Use Authorization (EUA). This EUA will remain in effect (meaning this test can be used) for the duration of the COVID-19 declaration under Section 564(b)(1) of the Act, 21 U.S.C. section 360bbb-3(b)(1), unless the authorization is terminated or revoked.  Performed at [facility], [place] [address], [place], [place] [address]     Signed:  Marcelyn Bruins MD.  [facility] 04/07/2021, 6:15 PM

## 2021-11-29 ENCOUNTER — Other Ambulatory Visit: Payer: Self-pay

## 2021-11-29 ENCOUNTER — Emergency Department (HOSPITAL_COMMUNITY)
Admission: EM | Admit: 2021-11-29 | Discharge: 2021-11-29 | Disposition: A | Discharge status: Home / Self Care | Payer: Medicaid - Out of State | Attending: Emergency Medicine | Admitting: Emergency Medicine

## 2021-11-29 ENCOUNTER — Encounter (HOSPITAL_COMMUNITY): Payer: Self-pay

## 2021-11-29 DIAGNOSIS — T40411A Poisoning by fentanyl or fentanyl analogs, accidental (unintentional), initial encounter: Secondary | ICD-10-CM | POA: Insufficient documentation

## 2021-11-29 DIAGNOSIS — R410 Disorientation, unspecified: Secondary | ICD-10-CM | POA: Diagnosis not present

## 2021-11-29 DIAGNOSIS — R4182 Altered mental status, unspecified: Secondary | ICD-10-CM | POA: Diagnosis present

## 2021-11-29 DIAGNOSIS — T50901A Poisoning by unspecified drugs, medicaments and biological substances, accidental (unintentional), initial encounter: Secondary | ICD-10-CM

## 2021-11-29 NOTE — Discharge Instructions (Signed)
Substance Abuse Treatment Programs ° °Intensive Outpatient Programs °[facility]     °[address]      °[place], [place]                   °[phone]      ° °[facility] °[address] °[place], [place] °[phone] ° °[facility]     °(Inpatient and outpatient)     °[address]           °[phone]   ° °[facility] °[phone] (Suboxone and Methadone) ° °[address]      °[place], [place] [address]      °[phone]      ° °[address] °[place], [place] °[phone] ° °[facility] (Outpatient/Inpatient, Chemical)    °(insurance only) [phone]      °       °[facility] (Groups & Residential) °[place], [place] °[phone] ° °   °[facility]     °[address]     °[place], [place]      °[phone]      ° °[facility] (for caregivers and family) °[address] °[place], [place] °336-299-4655 ° ° ° ° ° °Residential Treatment Programs °[facility]      °[address], [place], [place] [address]  °[phone]      ° °[facility] °[address] [place], [place] [address] °[phone] ° °[facility]        °[phone]      ° °[facility] °[phone] ° °[facility]             °[address]                                         °[place], [place]                                                °[phone] or [phone]                              ° °[facility] [place] °[address] °[place], [address] °[phone] ° °[facility]    °[address]      °[place], [place]     °[phone]      ° °[facility] °[address] °[place], [place] °[phone] ° °[facility]   °[address]     °[place], [place] [address]     °[phone]      °Admissions: 8am-3pm M-F ° °[facility] °[address] °[place],  [place] °[phone] ° °[facility]: Residential Program (90 Days)   °[place], [place]      °[phone] or [phone]    ° °[facility] °[place], [place] °(Walk in Hours over the weekend or by referral) ° °[facility] °[address], [place], [place] [address] °[phone] ° °Crisis Mobile: [facility]:  1-877-626-1772 (for crisis response 24 hours a day) °[facility] Hotline:      1-800-256-2452 °Outpatient Psychiatry and Counseling ° °[facility]: Mobile Crisis   Management 24 hours:  1-877-626-1772 ° °[facility] sliding scale fee and walk in schedule: M-F 8am-12pm/1pm-3pm °[address]  °[place], [place] [address] °[phone] ° °[facility] °[address] [place] °[place], [place] [address] °[phone] ° °[facility] ([facility])- new patient walk-in appointments available Monday - Friday 8am -3pm.          °[address] °[place], [place] [address] °336-676-6840 or crisis line- 336-676-6905 ° °[facility]/ Intensive Outpatient Therapy Program °[address] °[place], [place] [address] °336-832-9804 ° °[facility]                  °Crisis Services      °[phone]      °[address]     °[place], [place] [address]                ° °[facility]   °[facility] °[phone] °[address] °[place], [place] [address] ° ° °[facility]          °[address],  °[place], [place] [address]       °[phone] ° °[facility] °[address] °[place], [place] [address] °336-292-1510 ° °[facility]    °[address]    °[place], [place] [address]     °[phone]      ° °Parish McKinney, MD     °[address]     °[place] [address]     °[phone]     °  °[facility] °[address] °[place] [address] ° °[facility]     °[address]     °[place], [place]      °336-542-2076      ° °[facility] °Shamsher Ahluwalia, MD °[address] °[place], [place] [address] °[phone] ° °[facility]     °[address]     °[place], [place] [address]     °336-274-1237      ° °[facility] °[address] °[place], [place] [address] °336-854-4450 °Resources for Temporary Residential Assistance/Crisis Centers ° °DAY CENTERS °[facility] °M-F 8am-3pm   °[address] [place], [place] [address]   336-332-0824 °Services include: laundry, barbering, support groups, case management, phone  & computer access, showers, AA/NA mtgs, mental health/substance abuse nurse, job skills class, disability information, VA assistance, spiritual classes, etc.  ° °HOMELESS SHELTERS ° °[facility]     °[facility]   °[address], [place]     °[phone]       °       °[facility] (women and children)       °[address] °[place], [place] [address] °336-275-0820 °[email or web address] for application and process °Application Required ° °[facility]   °[address]    °[place] [address]     °[phone]       °             °[facility] °[address] °[place], [place] [address] °[phone] °[phone](schedule application appt.) °Application Required ° °[facility] (women only)    °[address]     °[place], [place] [address]     °336-884-1039      °  Intake starts 6pm daily °Need valid ID, SSC, & Police report °[facility] [place] °[address] °[place], [place] °336-881-5420 °Application Required ° °[facility] (men only)     °[address]      °[place], [place]     °[phone]      ° °[facility] °(Pregnant women only) °[address] °[place], [place] °336-275-0206 ° °[facility]      °[address]      °[place], [place] [address]     °[phone]      °       °[facility] °[address] °[place], [place] °336-723-1848 °90 day commitment/SA/Application process ° °[facility](men only)     °[address]     °[place], [place]     °[phone]       °Check-in at 7pm     °       °[facility] °[address] °[place], [place] [address] °336-248-6684 °Men/Women/Women and Children must be there by 7 pm ° °[facility] °[place], [place] °336-722-8721                ° °

## 2021-11-29 NOTE — ED Triage Notes (Addendum)
EMS reports bystander called, Pt picked up at [facility] street for possible OD, Pt states he snorted unknown amount of Fentanyl approx an hour ago. Pt arrived lethargic but A&O and answering questions appropriately.  BP 140/50 HR 80 RR 18 Sp02 89 RA (arrives on 2ltrs O2 with 98 Sp02)

## 2021-11-29 NOTE — ED Provider Notes (Signed)
[facility]-EMERGENCY DEPT Provider Note   CSN: [id number] Arrival date & time: 11/29/21  1224     History  Chief Complaint  Patient presents with   Ingestion    Joel Shaw is a 26 y.o. male.  HPI    26 year old male comes in with chief complaint of overdose.  Level 5 caveat for altered mental status.  According to EMS, bystanders called 911 after patient was found unresponsive.  Allegedly, at some point patient gives a history of using fentanyl today.  It is unclear if patient received any Narcan.  Patient is on oxygen.  Per our records, patient has opioid use disorder.   Home Medications Prior to Admission medications   Medication Sig Start Date End Date Taking? Authorizing Provider  ARIPiprazole (ABILIFY) 5 MG tablet Take 5 mg by mouth daily.    [provider]  hydrOXYzine (ATARAX/VISTARIL) 50 MG tablet Take 50 mg by mouth 3 (three) times daily as needed.    [provider]  naloxone [facility]) nasal spray 4 mg/0.1 mL Use after overdose with narcotic. 07/16/20   Wynetta Fines, MD  naltrexone (DEPADE) 50 MG tablet Take by mouth daily.    [provider]  traZODone (DESYREL) 100 MG tablet Take 100 mg by mouth at bedtime.    [provider]  apixaban (ELIQUIS) 5 MG TABS tablet Take 1 tablet (5 mg total) by mouth 2 (two) times daily. 07/19/20 07/19/20  Dione Booze, MD  dicyclomine (BENTYL) 20 MG tablet Take 1 tablet (20 mg total) by mouth every 6 (six) hours as needed for spasms (abdominal pain). Patient not taking: Reported on 07/16/2020 03/30/19 07/19/20  Cristina Gong, PA-C      Allergies    Patient has no known allergies.    Review of Systems   Review of Systems  Physical Exam Updated Vital Signs BP 111/68   Pulse (!) 51   Temp 97.7 F (36.5 C) (Oral)   Resp 16   SpO2 97%  Physical Exam Vitals and nursing note reviewed.  Constitutional:      Appearance: He is well-developed.     Comments:  Responsive to noxious stimuli only  HENT:     Head: Atraumatic.  Eyes:     Comments: Pupils are 2 mm, equal  Cardiovascular:     Rate and Rhythm: Normal rate.  Pulmonary:     Effort: Pulmonary effort is normal.  Musculoskeletal:     Cervical back: Neck supple.  Skin:    General: Skin is warm.  Neurological:     Mental Status: He is disoriented.     ED Results / Procedures / Treatments   Labs (all labs ordered are listed, but only abnormal results are displayed) Labs Reviewed - No data to display  EKG None  Radiology No results found.  Procedures .Critical Care  Performed by: Derwood Kaplan, MD Authorized by: Derwood Kaplan, MD   Critical care provider statement:    Critical care time (minutes):  30   Critical care was necessary to treat or prevent imminent or life-threatening deterioration of the following conditions:  Toxidrome   Critical care was time spent personally by me on the following activities:  Development of treatment plan with patient or surrogate, discussions with consultants, evaluation of patient's response to treatment, examination of patient, ordering and review of laboratory studies, ordering and review of radiographic studies, ordering and performing treatments and interventions, pulse oximetry, re-evaluation of patient's condition, review of old charts and obtaining  history from patient or surrogate     Medications Ordered in ED Medications - No data to display  ED Course/ Medical Decision Making/ A&P                           Medical Decision Making Problems Addressed: Accidental overdose, initial encounter: acute illness or injury with systemic symptoms that poses a threat to life or bodily functions  26 year old male comes in with chief complaint of overdose.  Patient was found to be unresponsive by bystanders and EMS were called.  Patient allegedly had admitted to snorting fentanyl.  He is on 2 L of oxygen and minimally  responsive.  Clinical suspicion is high for opiate use/overdose. For now, patient does not have profound respiratory depression.  He does not need any further Narcan.  We will continue to closely monitor.  Reassessment at 2:30 PM Patient now arousable.  Indicates that he did snort some drugs earlier today.  He states that he thinks it was fentanyl. There is no SI, HI.  He is requesting water. I have discontinued his oxygen.  Reassessment at 330: Patient continues to be arousable. He is stable for discharge from my perspective, will make sure he is able to ambulate before his discharge.  Vital signs are reassuring.  Final Clinical Impression(s) / ED Diagnoses Final diagnoses:  Accidental overdose, initial encounter    Rx / DC Orders ED Discharge Orders     None         Derwood Kaplan, MD 11/29/21 1605

## 2021-12-02 ENCOUNTER — Other Ambulatory Visit: Payer: Self-pay

## 2021-12-02 ENCOUNTER — Emergency Department (HOSPITAL_COMMUNITY)
Admission: EM | Admit: 2021-12-02 | Discharge: 2021-12-03 | Disposition: A | Discharge status: Home Health | Payer: Medicaid - Out of State | Attending: Emergency Medicine | Admitting: Emergency Medicine

## 2021-12-02 ENCOUNTER — Encounter (HOSPITAL_COMMUNITY): Payer: Self-pay | Admitting: Emergency Medicine

## 2021-12-02 DIAGNOSIS — F1510 Other stimulant abuse, uncomplicated: Secondary | ICD-10-CM | POA: Diagnosis not present

## 2021-12-02 DIAGNOSIS — Z5900 Homelessness unspecified: Secondary | ICD-10-CM | POA: Diagnosis not present

## 2021-12-02 DIAGNOSIS — Z7901 Long term (current) use of anticoagulants: Secondary | ICD-10-CM | POA: Insufficient documentation

## 2021-12-02 DIAGNOSIS — F1110 Opioid abuse, uncomplicated: Secondary | ICD-10-CM | POA: Diagnosis present

## 2021-12-02 DIAGNOSIS — Y900 Blood alcohol level of less than 20 mg/100 ml: Secondary | ICD-10-CM | POA: Insufficient documentation

## 2021-12-02 DIAGNOSIS — F1910 Other psychoactive substance abuse, uncomplicated: Secondary | ICD-10-CM

## 2021-12-02 LAB — COMPREHENSIVE METABOLIC PANEL
ALT: 100 U/L — ABNORMAL HIGH (ref 0–44)
AST: 44 U/L — ABNORMAL HIGH (ref 15–41)
Albumin: 3.9 g/dL (ref 3.5–5.0)
Alkaline Phosphatase: 78 U/L (ref 38–126)
Anion gap: 9 (ref 5–15)
BUN: 9 mg/dL (ref 6–20)
CO2: 27 mmol/L (ref 22–32)
Calcium: 9.3 mg/dL (ref 8.9–10.3)
Chloride: 104 mmol/L (ref 98–111)
Creatinine, Ser: 0.84 mg/dL (ref 0.61–1.24)
GFR, Estimated: >60 mL/min (ref >60)
Glucose, Bld: 103 mg/dL — ABNORMAL HIGH (ref 70–99)
Potassium: 3.7 mmol/L (ref 3.5–5.1)
Sodium: 140 mmol/L (ref 135–145)
Total Bilirubin: 0.9 mg/dL (ref 0.3–1.2)
Total Protein: 6.4 g/dL — ABNORMAL LOW (ref 6.5–8.1)

## 2021-12-02 LAB — CBC
HCT: 43.4 % (ref 39.0–52.0)
Hemoglobin: 14.7 g/dL (ref 13.0–17.0)
MCH: 29.8 pg (ref 26.0–34.0)
MCHC: 33.9 g/dL (ref 30.0–36.0)
MCV: 87.9 fL (ref 80.0–100.0)
Platelets: UNDETERMINED 10*3/uL (ref 150–400)
RBC: 4.94 MIL/uL (ref 4.22–5.81)
RDW: 13.5 % (ref 11.5–15.5)
WBC: 8.5 10*3/uL (ref 4.0–10.5)
nRBC: 0 % (ref 0.0–0.2)

## 2021-12-02 LAB — ETHANOL: Alcohol, Ethyl (B): <10 mg/dL (ref <10)

## 2021-12-02 NOTE — ED Provider Notes (Signed)
[facility] EMERGENCY DEPARTMENT Provider Note   CSN: [id number] Arrival date & time: 12/02/21  2051     History {Add pertinent medical, surgical, social history, OB history to HPI:1} Chief Complaint  Patient presents with   Detox: Heroin Addiction     Joel Shaw is a 26 y.o. male.  The history is provided by the patient and medical records.   26 y.o. M presenting to the ED requesting detox.  States he uses "all kinds of drugs" but recently has been using heroin and fentanyl more.  He states he injects and snorts, usually on a daily basis.  He does report some occasional alcohol use but not heavily or to excess.  He denies SI/HI/AVH.  He is currently homeless.  States he has completed rehab several times in the past, most recent earlier this year.  He states he will get sober for a month or two but always relapses.  He does not currently have close friends/family in the area.  Denies any physical complaints.  Home Medications Prior to Admission medications   Medication Sig Start Date End Date Taking? Authorizing Provider  naloxone [facility]) nasal spray 4 mg/0.1 mL Use after overdose with narcotic. Patient taking differently: Place 1 spray into the nose daily as needed (for overdose). 07/16/20  Yes Wynetta Fines, MD  apixaban (ELIQUIS) 5 MG TABS tablet Take 1 tablet (5 mg total) by mouth 2 (two) times daily. 07/19/20 07/19/20  Dione Booze, MD  dicyclomine (BENTYL) 20 MG tablet Take 1 tablet (20 mg total) by mouth every 6 (six) hours as needed for spasms (abdominal pain). Patient not taking: Reported on 07/16/2020 03/30/19 07/19/20  Cristina Gong, PA-C      Allergies    Patient has no known allergies.    Review of Systems   Review of Systems  Psychiatric/Behavioral:         Detox  All other systems reviewed and are negative.   Physical Exam Updated Vital Signs BP 120/77 (BP Location: Right Arm)   Pulse 80   Temp 98 F (36.7 C)   Resp 18   SpO2 100%   Physical Exam Vitals and nursing note reviewed.  Constitutional:      Appearance: He is well-developed.     Comments: Sleeping, awoken for exam, no distress  HENT:     Head: Normocephalic and atraumatic.  Eyes:     Conjunctiva/sclera: Conjunctivae normal.     Pupils: Pupils are equal, round, and reactive to light.  Cardiovascular:     Rate and Rhythm: Normal rate and regular rhythm.     Heart sounds: Normal heart sounds.  Pulmonary:     Effort: Pulmonary effort is normal.     Breath sounds: Normal breath sounds.  Abdominal:     General: Bowel sounds are normal.     Palpations: Abdomen is soft.  Musculoskeletal:        General: Normal range of motion.     Cervical back: Normal range of motion.  Skin:    General: Skin is warm and dry.  Neurological:     Mental Status: He is alert and oriented to person, place, and time.  Psychiatric:     Comments: Denies SI/HI/AVH     ED Results / Procedures / Treatments   Labs (all labs ordered are listed, but only abnormal results are displayed) Labs Reviewed  COMPREHENSIVE METABOLIC PANEL - Abnormal; Notable for the following components:      Result Value  Glucose, Bld 103 (*)    Total Protein 6.4 (*)    AST 44 (*)    ALT 100 (*)    All other components within normal limits  ETHANOL  CBC  RAPID URINE DRUG SCREEN, HOSP PERFORMED    EKG None  Radiology No results found.  Procedures Procedures  {Document cardiac monitor, telemetry assessment procedure when appropriate:1}  Medications Ordered in ED Medications - No data to display  ED Course/ Medical Decision Making/ A&P                           Medical Decision Making Amount and/or Complexity of Data Reviewed Labs: ordered.   ***  {Document critical care time when appropriate:1} {Document review of labs and clinical decision tools ie heart score, Chads2Vasc2 etc:1}  {Document your independent review of radiology images, and any outside records:1} {Document  your discussion with family members, caretakers, and with consultants:1} {Document social determinants of health affecting pt's care:1} {Document your decision making why or why not admission, treatments were needed:1} Final Clinical Impression(s) / ED Diagnoses Final diagnoses:  None    Rx / DC Orders ED Discharge Orders     None

## 2021-12-02 NOTE — ED Triage Notes (Signed)
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

## 2021-12-02 NOTE — Discharge Instructions (Signed)
You can call the detox centers and see about getting treatment set up. Your screening labs are on back, they will need to see these before you enter treatment.

## 2022-12-31 IMAGING — CT CT HEAD W/O CM
4 series · 15 of 47 positions shown, 17 images · non-contrast
Comparison: None.

CLINICAL DATA: Head trauma, skull fracture or hematoma (Age
18-64y); Polytrauma, blunt; Facial trauma, blunt.

EXAM:
CT HEAD WITHOUT CONTRAST
CT MAXILLOFACIAL WITHOUT CONTRAST
CT CERVICAL SPINE WITHOUT CONTRAST
TECHNIQUE: Multidetector CT imaging of the head, cervical spine, and
maxillofacial structures were performed using the standard protocol
without intravenous contrast. Multiplanar CT image reconstructions
of the cervical spine and maxillofacial structures were also
generated.

[Series 4: head wo · axial · 0.43mm/px · z∈[-46,+74]mm · 7 of 32 slices shown, 9 images]
[im 4/32  brain]
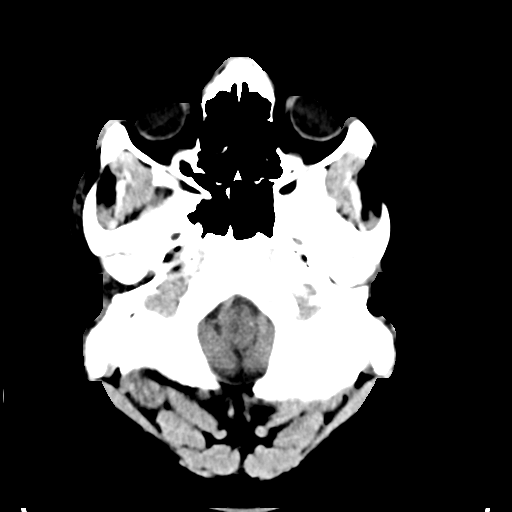
[im 4/32  bone]
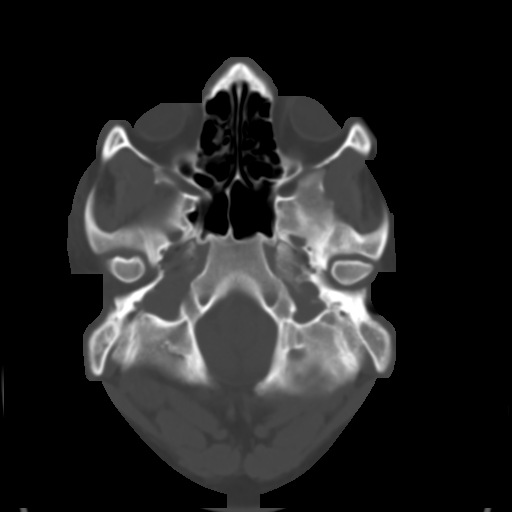
[im 8/32  brain]
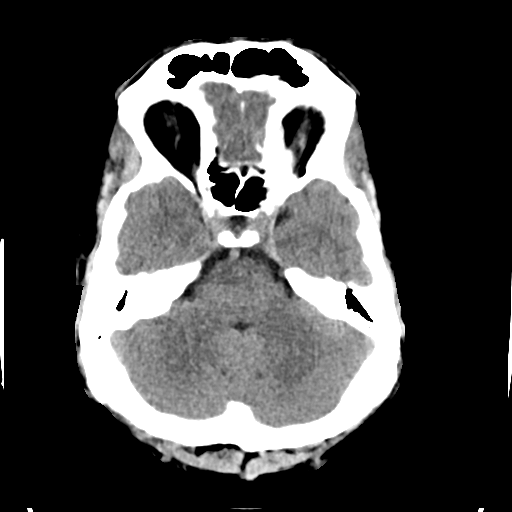
[im 12/32  brain]
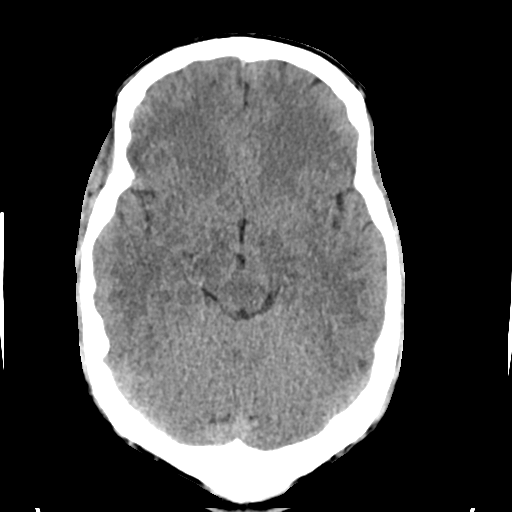
[im 16/32  brain]
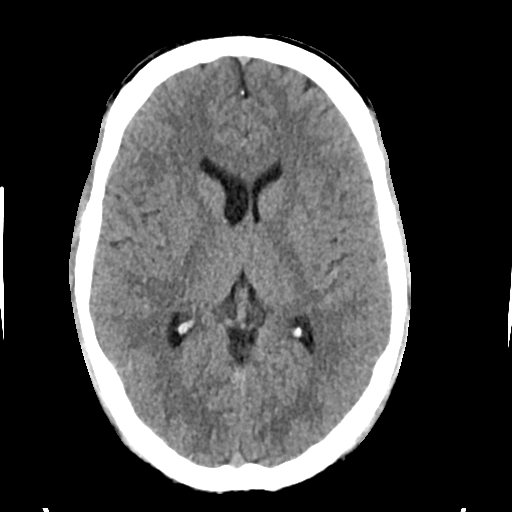
[im 20/32  brain]
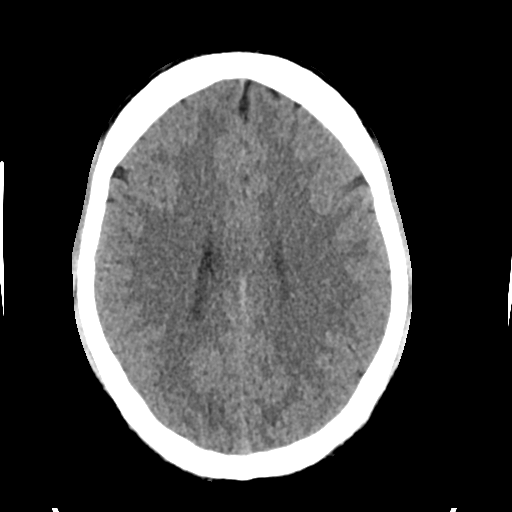
[im 20/32  bone]
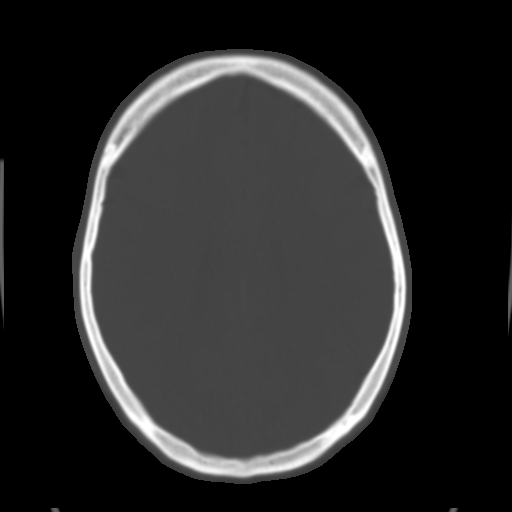
[im 24/32  brain]
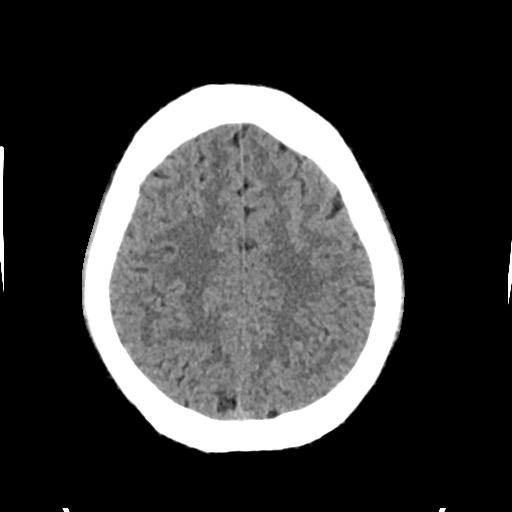
[im 28/32  brain]
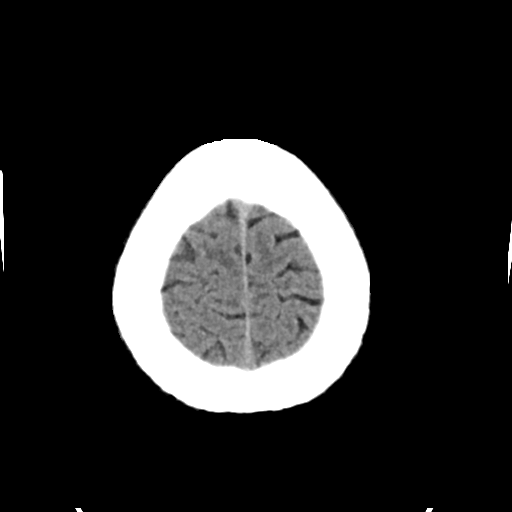

[Series 5: head bone · axial · 0.43mm/px · z∈[-46,-30]mm · 2 of 78 slices shown]
[im 8/78  bone]
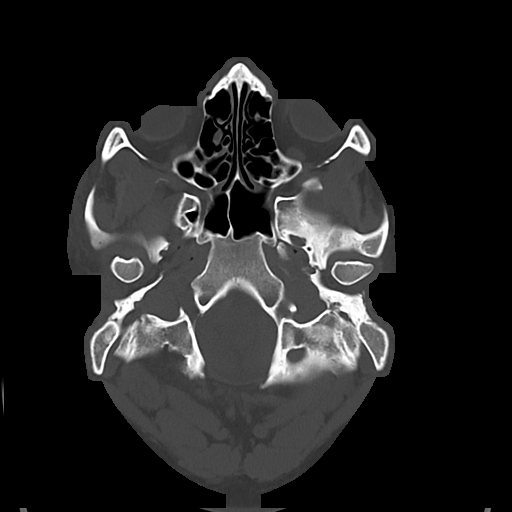
[im 16/78  bone]
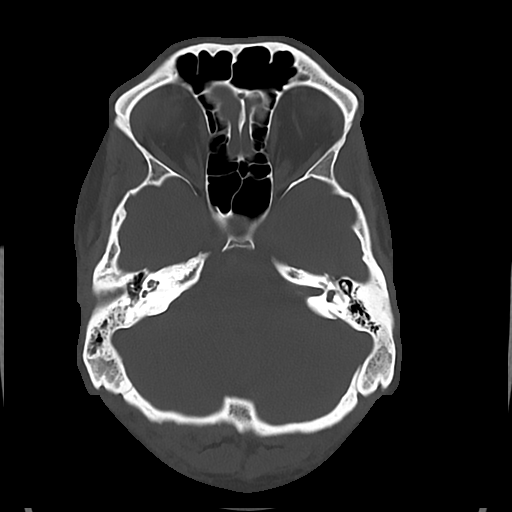

[Series 6: cor soft · coronal · 0.33mm/px · 3 of 72 slices shown]
[im 24/72  brain]
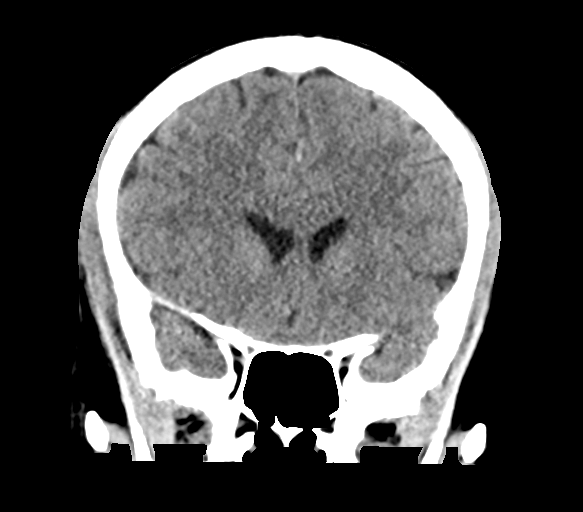
[im 32/72  brain]
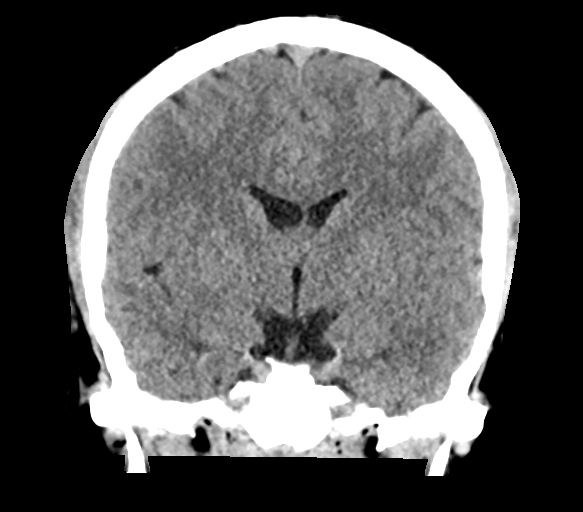
[im 40/72  brain]
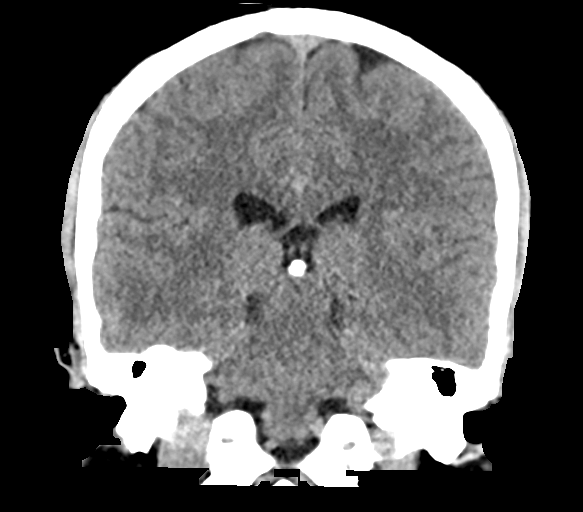

[Series 7: sag soft · sagittal · 0.33mm/px · 3 of 64 slices shown]
[im 22/64  brain]
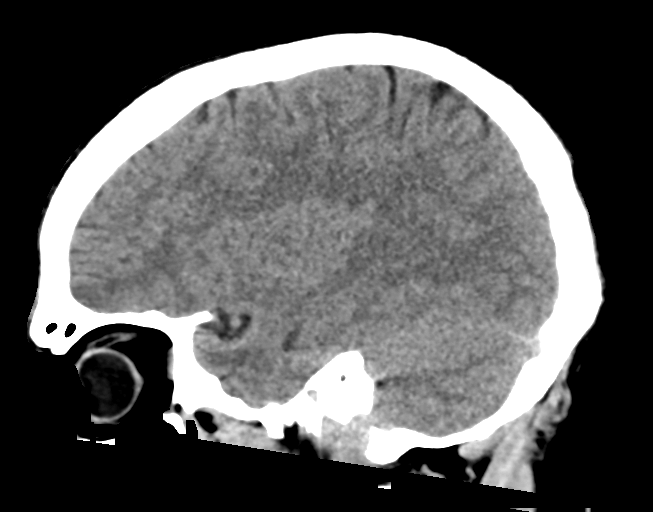
[im 32/64  brain]
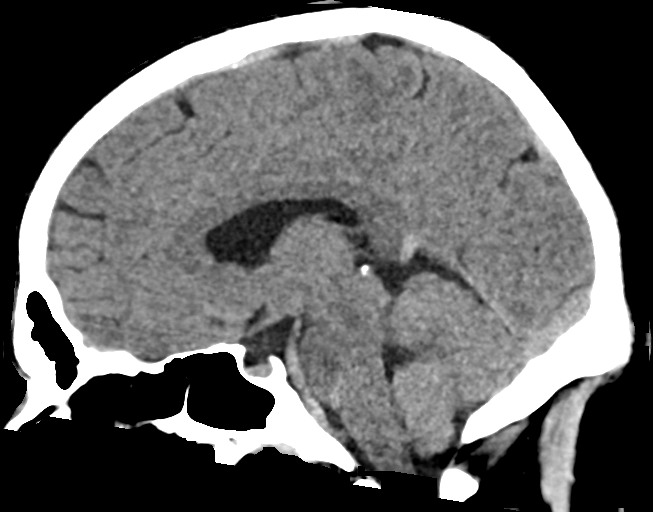
[im 43/64  brain]
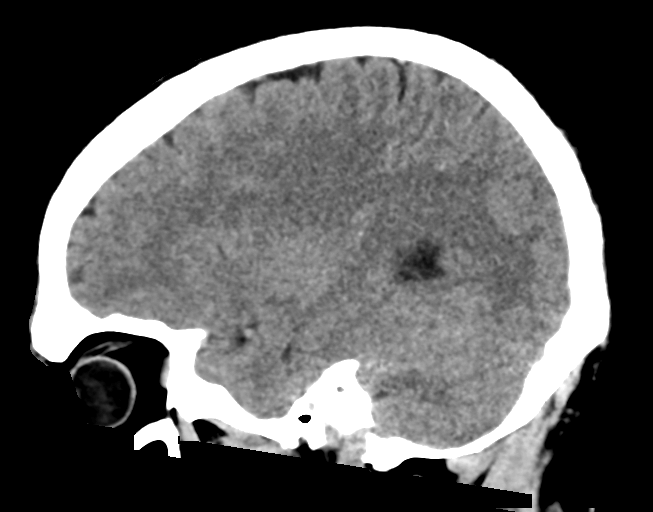

[15 of 47 positions shown; findings below may reference images not displayed]

FINDINGS: CT HEAD FINDINGS

Brain: Normal anatomic configuration. No abnormal intra or
extra-axial mass lesion or fluid collection. No abnormal mass effect
or midline shift. No evidence of acute intracranial hemorrhage or
infarct. Ventricular size is normal. Cerebellum unremarkable.

Vascular: Unremarkable

Skull: Intact

Other: There is fluid opacification of the right mastoid air cells
and middle ear cavity. No definite fracture identified. No osseous
erosion. Left mastoid air cells and middle ear cavity are clear.

CT MAXILLOFACIAL FINDINGS

Osseous: No acute facial fracture

Orbits: Negative. No traumatic or inflammatory finding.

Sinuses: Clear.

Soft tissues: There is moderate right facial soft tissue swelling
superficial to the right zygoma, right masseter, and right mandible.

CT CERVICAL SPINE FINDINGS

Alignment: Normal.

Skull base and vertebrae: No acute fracture. No primary bone lesion
or focal pathologic process.

Soft tissues and spinal canal: No prevertebral fluid or swelling. No
visible canal hematoma.

Disc levels: Intervertebral disc height is preserved. Prevertebral
soft tissues are not thickened. Review of the axial images
demonstrates no significant neuroforaminal narrowing or canal
stenosis.

Upper chest: There is extensive airspace infiltrate within the
visualized lung apices which may relate to acute pulmonary
hemorrhage, infection, or pulmonary edema.

Other: None
IMPRESSION: No acute intracranial injury.  No calvarial fracture.

Fluid opacification of the right mastoid air cells and middle ear
cavity. No definite fracture. Clinical correlation for signs and
symptoms of otomastoiditis may be helpful.

Right facial soft tissue swelling.  No facial fracture.

No acute fracture or listhesis of the cervical spine.

Extensive airspace infiltrate within the visualized lung apices,
nonspecific.

## 2022-12-31 IMAGING — DX DG CHEST 1V PORT
2 series · 2 of 2 positions shown · non-contrast
Comparison: 07/19/2020

CLINICAL DATA: Hypoxia.  Overdose.

EXAM:
PORTABLE CHEST 1 VIEW

[chest ap (1 of 2)]
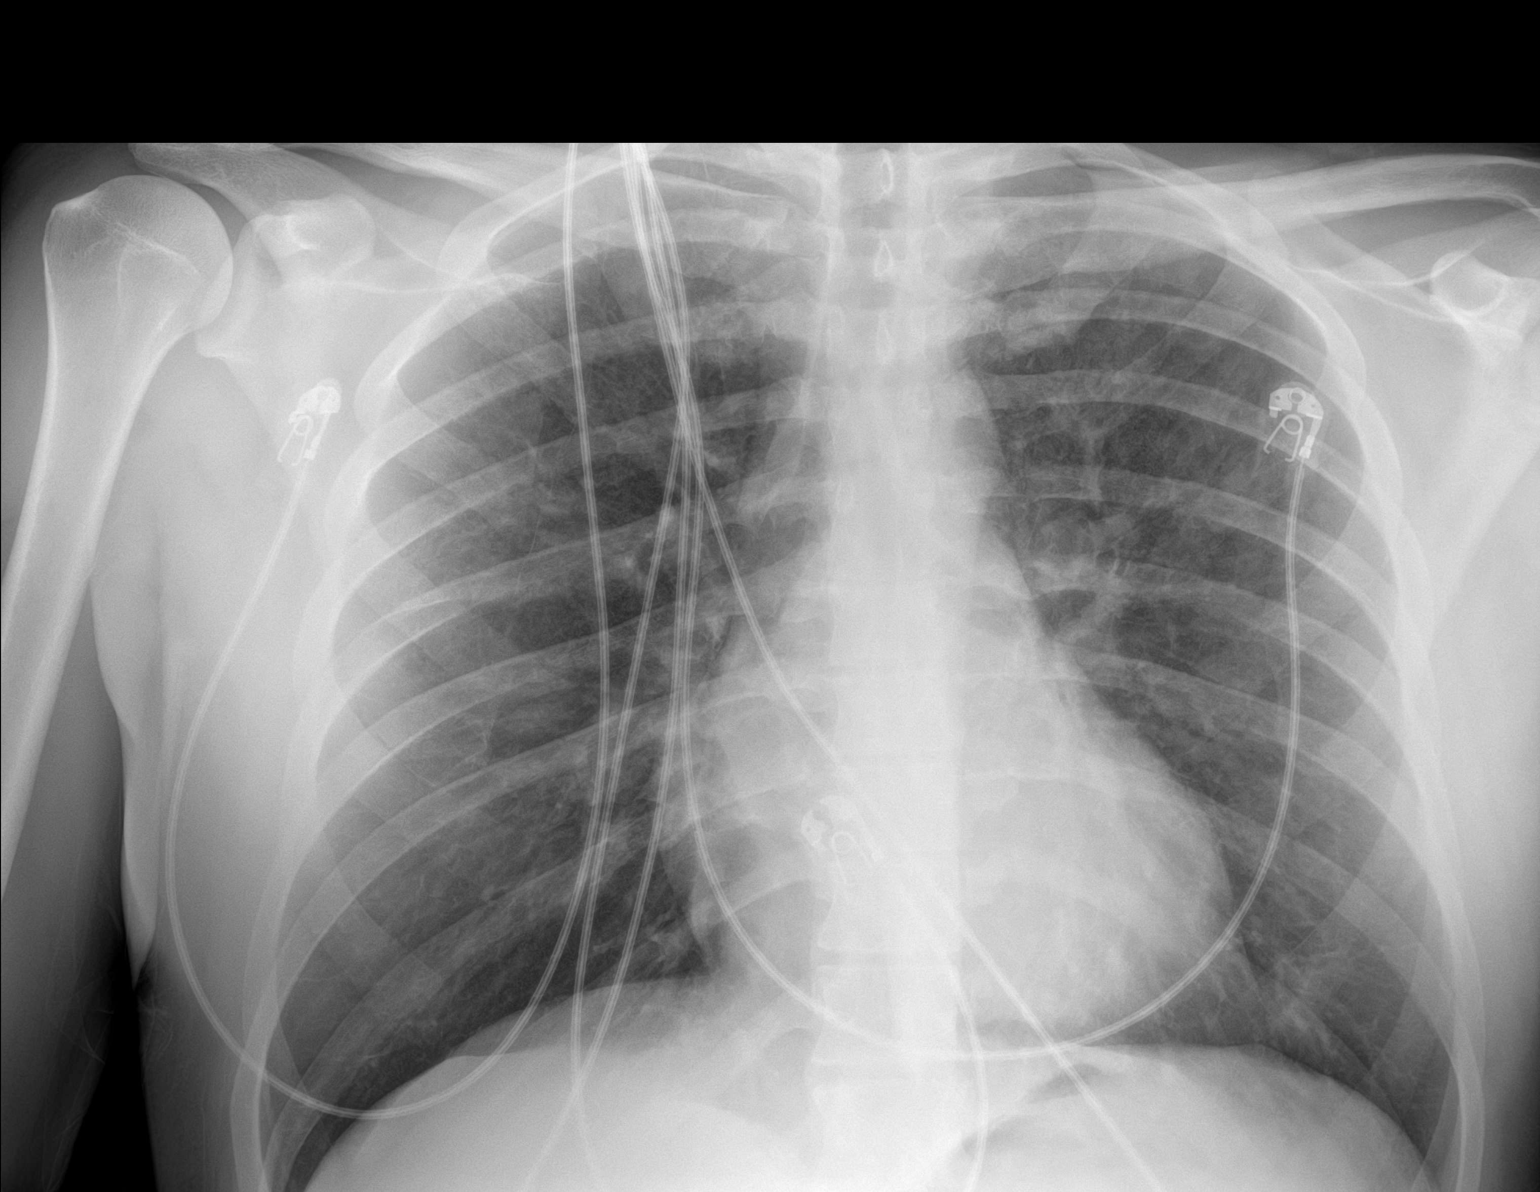

[chest ap (2 of 2)]
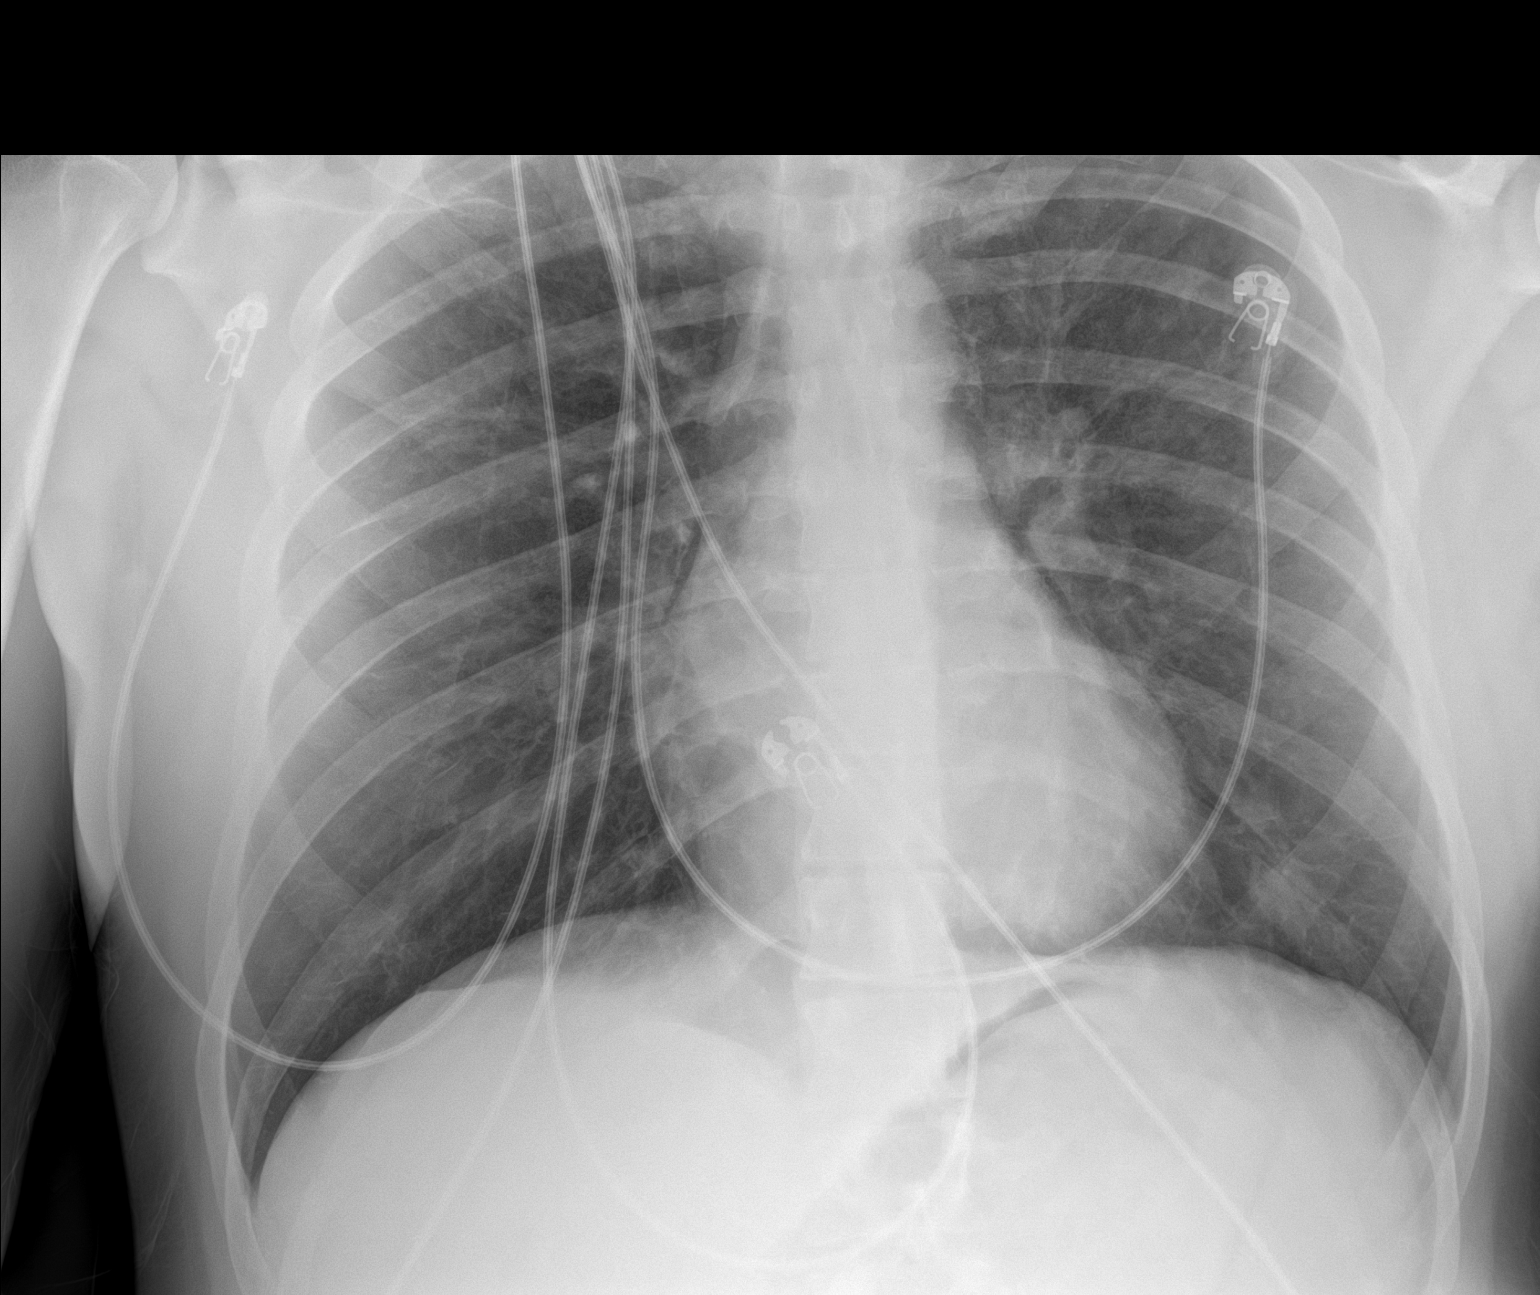

[2 of 2 positions shown; findings below may reference images not displayed]

FINDINGS: The heart size and mediastinal contours are within normal limits.
Both lungs are clear. The visualized skeletal structures are
unremarkable.
IMPRESSION: No active disease.

## 2022-12-31 IMAGING — CT CT MAXILLOFACIAL W/O CM
3 of 6 series · 14 of 47 positions shown, 17 images · non-contrast
Comparison: None.

CLINICAL DATA: Head trauma, skull fracture or hematoma (Age
18-64y); Polytrauma, blunt; Facial trauma, blunt.

EXAM:
CT HEAD WITHOUT CONTRAST
CT MAXILLOFACIAL WITHOUT CONTRAST
CT CERVICAL SPINE WITHOUT CONTRAST
TECHNIQUE: Multidetector CT imaging of the head, cervical spine, and
maxillofacial structures were performed using the standard protocol
without intravenous contrast. Multiplanar CT image reconstructions
of the cervical spine and maxillofacial structures were also
generated.

[Series 4: maxilllofacial 2.0 hr40 3 · axial · 0.34mm/px · z∈[-156,-12]mm · 9 of 85 slices shown, 12 images]
[im 7/85  brain]
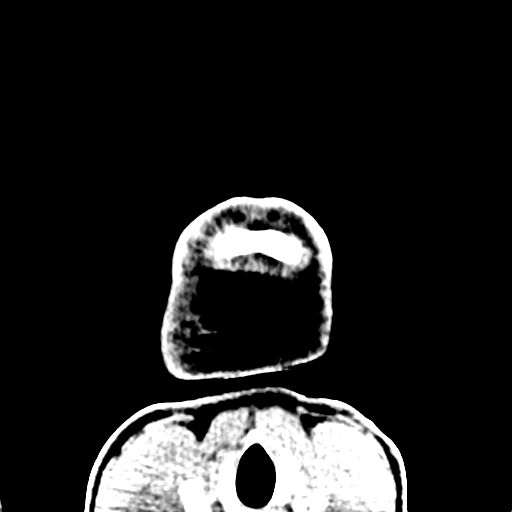
[im 7/85  bone]
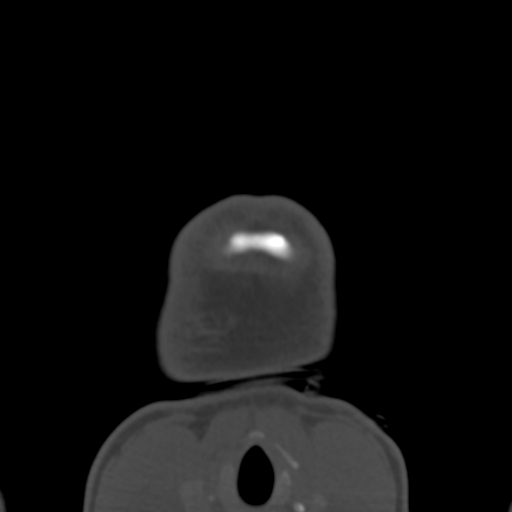
[im 19/85  bone]
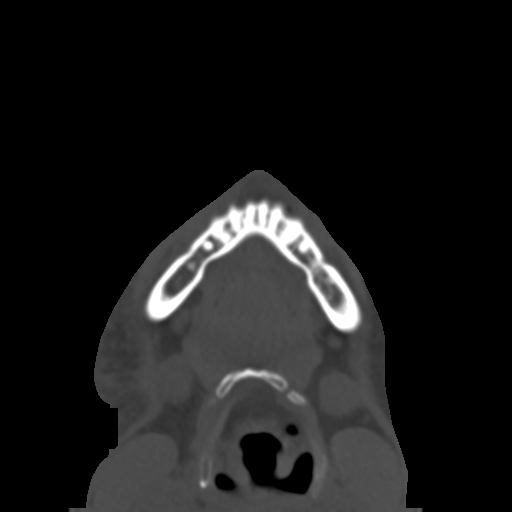
[im 25/85  bone]
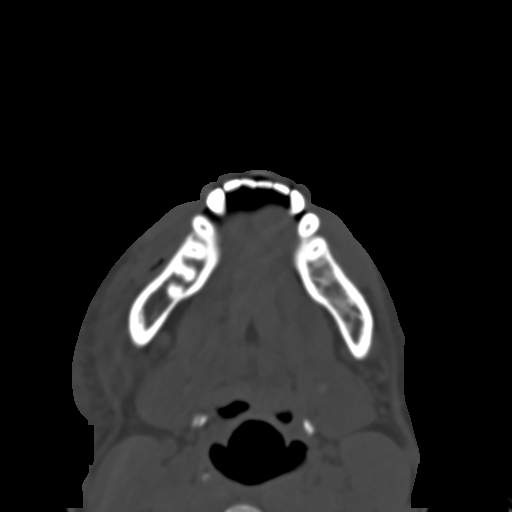
[im 37/85  bone]
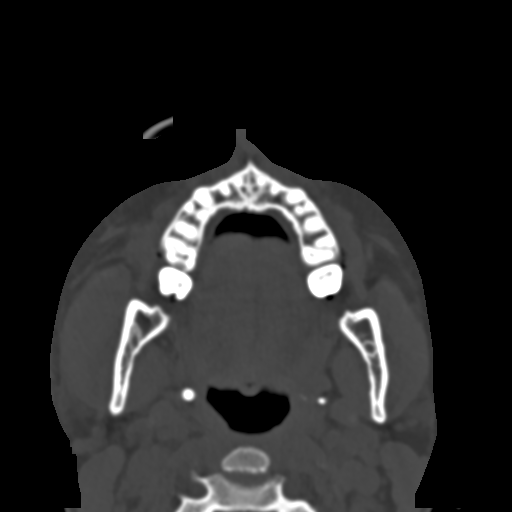
[im 43/85  brain]
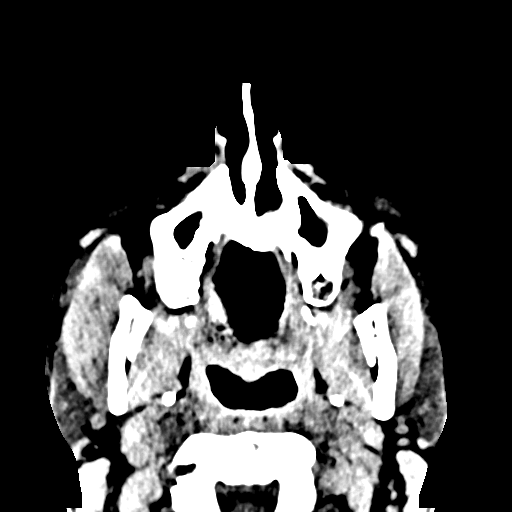
[im 43/85  bone]
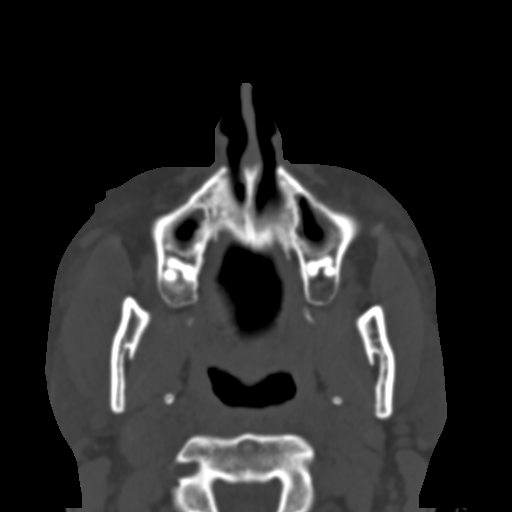
[im 49/85  bone]
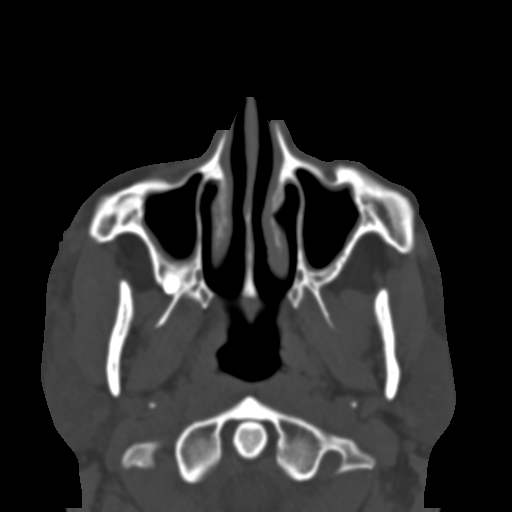
[im 61/85  bone]
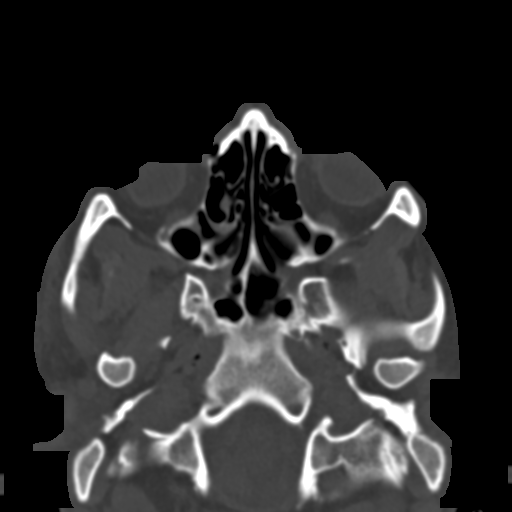
[im 67/85  bone]
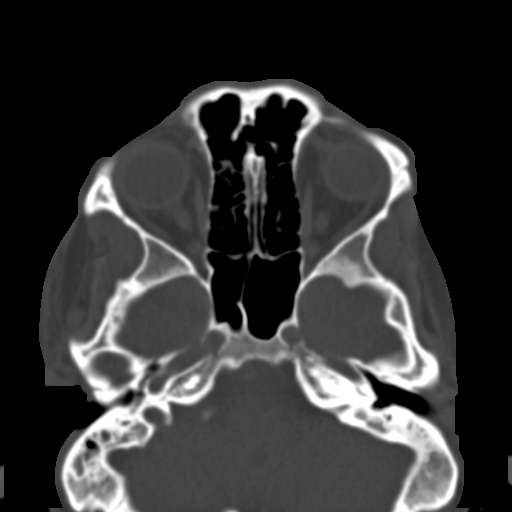
[im 79/85  brain]
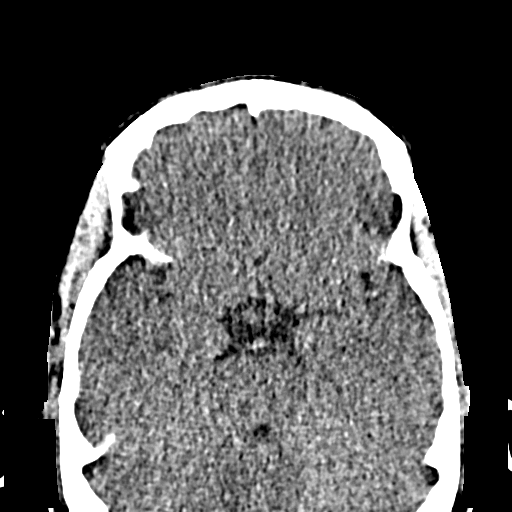
[im 79/85  bone]
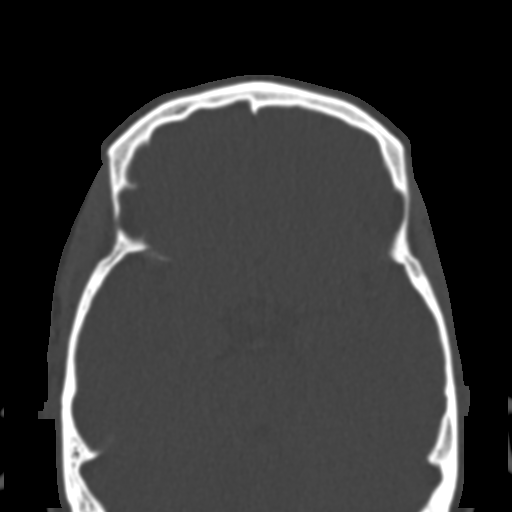

[Series 8: st cor · coronal · 0.33mm/px · 3 of 94 slices shown]
[im 24/94  bone]
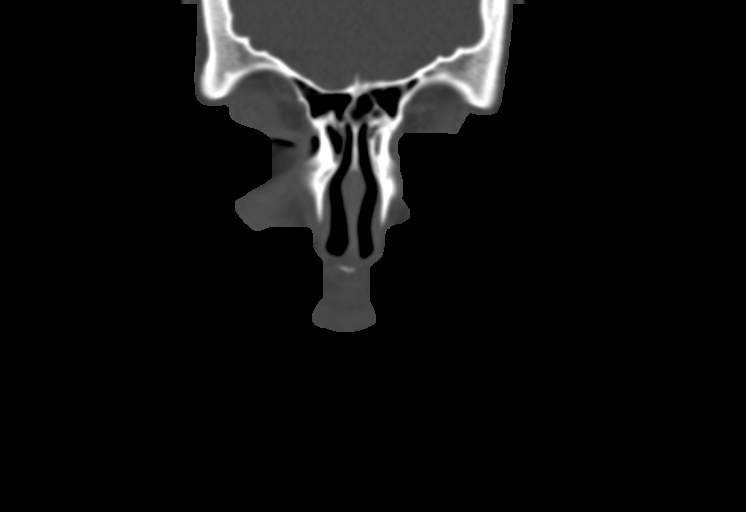
[im 47/94  bone]
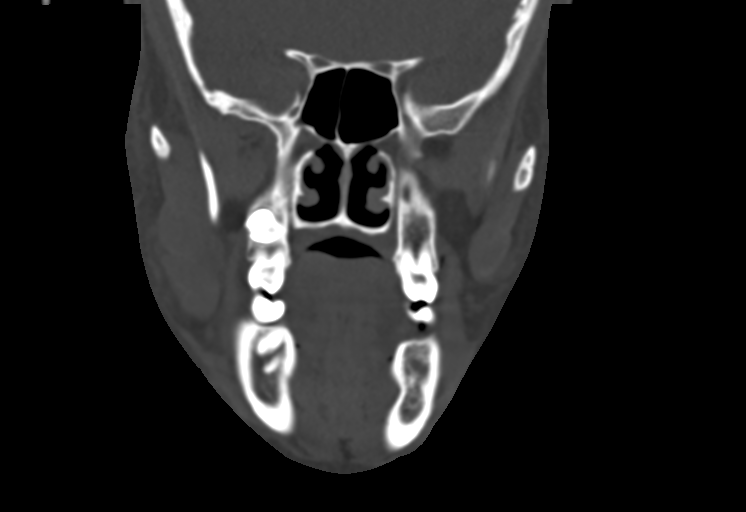
[im 70/94  bone]
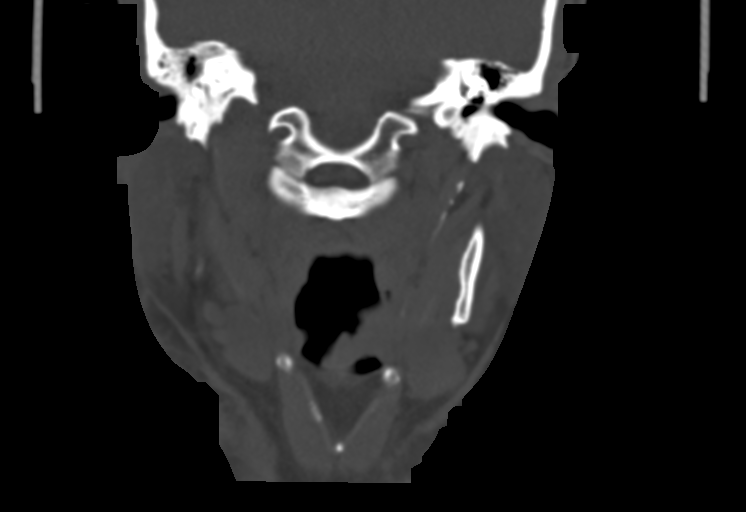

[Series 11: bone sag · sagittal · 0.33mm/px · 2 of 172 slices shown]
[im 58/172  bone]
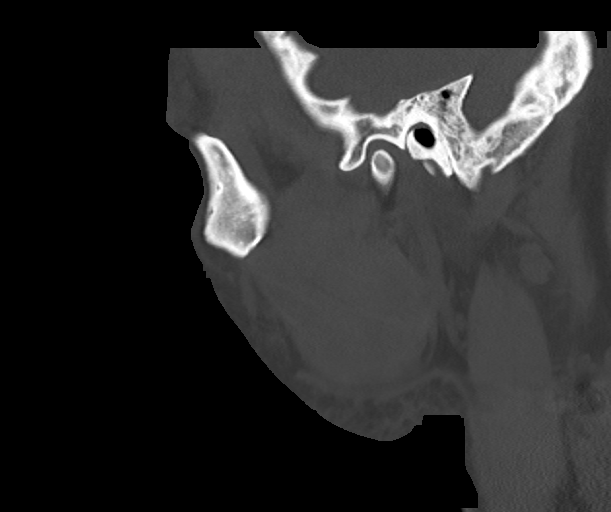
[im 115/172  bone]
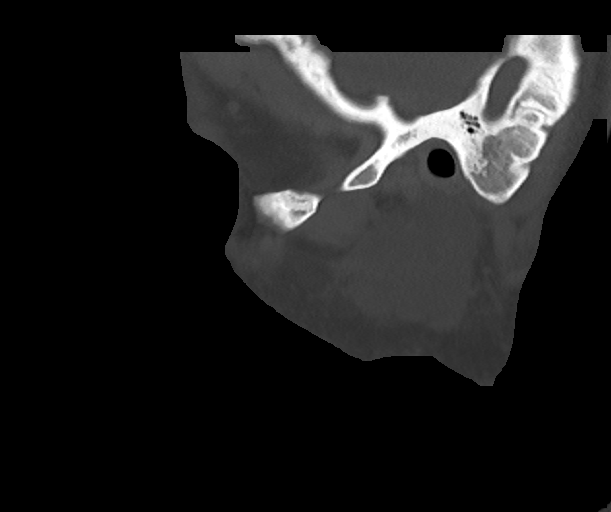

[14 of 47 positions shown; findings below may reference images not displayed]

FINDINGS: CT HEAD FINDINGS

Brain: Normal anatomic configuration. No abnormal intra or
extra-axial mass lesion or fluid collection. No abnormal mass effect
or midline shift. No evidence of acute intracranial hemorrhage or
infarct. Ventricular size is normal. Cerebellum unremarkable.

Vascular: Unremarkable

Skull: Intact

Other: There is fluid opacification of the right mastoid air cells
and middle ear cavity. No definite fracture identified. No osseous
erosion. Left mastoid air cells and middle ear cavity are clear.

CT MAXILLOFACIAL FINDINGS

Osseous: No acute facial fracture

Orbits: Negative. No traumatic or inflammatory finding.

Sinuses: Clear.

Soft tissues: There is moderate right facial soft tissue swelling
superficial to the right zygoma, right masseter, and right mandible.

CT CERVICAL SPINE FINDINGS

Alignment: Normal.

Skull base and vertebrae: No acute fracture. No primary bone lesion
or focal pathologic process.

Soft tissues and spinal canal: No prevertebral fluid or swelling. No
visible canal hematoma.

Disc levels: Intervertebral disc height is preserved. Prevertebral
soft tissues are not thickened. Review of the axial images
demonstrates no significant neuroforaminal narrowing or canal
stenosis.

Upper chest: There is extensive airspace infiltrate within the
visualized lung apices which may relate to acute pulmonary
hemorrhage, infection, or pulmonary edema.

Other: None
IMPRESSION: No acute intracranial injury.  No calvarial fracture.

Fluid opacification of the right mastoid air cells and middle ear
cavity. No definite fracture. Clinical correlation for signs and
symptoms of otomastoiditis may be helpful.

Right facial soft tissue swelling.  No facial fracture.

No acute fracture or listhesis of the cervical spine.

Extensive airspace infiltrate within the visualized lung apices,
nonspecific.
